# Patient Record
Sex: Male | Born: 1990 | Race: Black or African American | Hispanic: No
Health system: Southern US, Community
[De-identification: ages and names within clinical notes are randomized; demographics above are authoritative.]

## PROBLEM LIST (undated history)

## (undated) DIAGNOSIS — D573 Sickle-cell trait: Secondary | ICD-10-CM

---

## 2015-02-25 ENCOUNTER — Inpatient Hospital Stay: Admit: 2015-02-25 | Discharge: 2015-02-26 | Payer: BLUE CROSS/BLUE SHIELD | Attending: Emergency Medicine

## 2015-02-25 ENCOUNTER — Emergency Department
Admit: 2015-02-25 | Payer: BLUE CROSS/BLUE SHIELD | Primary: Student in an Organized Health Care Education/Training Program

## 2015-02-25 DIAGNOSIS — R569 Unspecified convulsions: Secondary | ICD-10-CM

## 2015-02-25 LAB — CBC WITH AUTOMATED DIFF
BASOPHILS: 0.4 % (ref 0–3)
EOSINOPHILS: 0.1 % (ref 0–5)
HCT: 41.7 % (ref 37.0–50.0)
HGB: 13.3 gm/dl (ref 12.4–17.2)
IMMATURE GRANULOCYTES: 0.4 % (ref 0.0–3.0)
LYMPHOCYTES: 9 % — ABNORMAL LOW (ref 28–48)
MCH: 28.4 pg (ref 23.0–34.6)
MCHC: 31.9 gm/dl (ref 30.0–36.0)
MCV: 88.9 fL (ref 80.0–98.0)
MONOCYTES: 9.1 % (ref 1–13)
MPV: 11 fL — ABNORMAL HIGH (ref 6.0–10.0)
NEUTROPHILS: 81 % — ABNORMAL HIGH (ref 34–64)
NRBC: 0 (ref 0–0)
PLATELET: 177 10*3/uL (ref 140–450)
RBC: 4.69 M/uL (ref 3.80–5.70)
RDW-SD: 43 (ref 35.1–43.9)
WBC: 12.3 10*3/uL — ABNORMAL HIGH (ref 4.0–11.0)

## 2015-02-25 LAB — MAGNESIUM: Magnesium: 1.8 mg/dl (ref 1.8–2.4)

## 2015-02-25 LAB — METABOLIC PANEL, BASIC
BUN: 14 mg/dl (ref 7–25)
CO2: 27 mEq/L (ref 21–32)
Calcium: 8.5 mg/dl (ref 8.5–10.1)
Chloride: 107 mEq/L (ref 98–107)
Creatinine: 1.1 mg/dl (ref 0.6–1.3)
GFR est AA: >60
GFR est non-AA: >60
Glucose: 115 mg/dl — ABNORMAL HIGH (ref 74–106)
Potassium: 4.1 mEq/L (ref 3.5–5.1)
Sodium: 141 mEq/L (ref 136–145)

## 2015-02-25 LAB — ETHYL ALCOHOL: ALCOHOL(ETHYL),SERUM: <3 mg/dl (ref 0.0–9.0)

## 2015-02-25 MED ORDER — SODIUM CHLORIDE 0.9 % IJ SYRG
Freq: Once | INTRAMUSCULAR | Status: DC
Start: 2015-02-25 — End: 2015-02-26

## 2015-02-25 NOTE — ED Notes (Signed)
witnessed seizure

## 2015-02-25 NOTE — ED Notes (Signed)
I have reviewed discharge instructions with the patient.  The patient verbalized understanding.Patient armband removed and given to patient to take home.  Patient was informed of the privacy risks if armband lost or stolen. Lab and CT result printed and [facility] with pt.

## 2015-02-26 LAB — DRUG SCREEN, URINE
Amphetamine: NEGATIVE
Barbiturates: NEGATIVE
Benzodiazepines: NEGATIVE
Cocaine: NEGATIVE
Marijuana: NEGATIVE
Methadone: NEGATIVE
Opiates: NEGATIVE
Phencyclidine: NEGATIVE

## 2015-02-26 LAB — EKG, 12 LEAD, INITIAL
Atrial Rate: 77 {beats}/min
Calculated P Axis: 75 degrees
Calculated R Axis: 79 degrees
Calculated T Axis: 54 degrees
P-R Interval: 172 ms
Q-T Interval: 372 ms
QRS Duration: 88 ms
QTC Calculation (Bezet): 420 ms
Ventricular Rate: 77 {beats}/min

## 2015-02-26 NOTE — ED Provider Notes (Signed)
[facility] TREATMENT REPORT  NAME:  Rylee, Arad  SEX:   M  ADMIT: 02/25/2015  DOB:   04-01-1991  MR#    [id number]  ROOM:  [id number]  TIME DICTATED: 08 52 PM  ACCT#  [id number]        CHIEF COMPLAINT:  Seizure.    HISTORY OF PRESENT ILLNESS:  This is a 24 year old male who [facility]   with a history of witnessed seizure. The paperwork at bedside and the staff   states that he was seen to have convulsions by the medical staff at facility   after he was in the general population in his pod became shaking.  He was   noted by EMS to have an episode of nausea, vomiting and postictal.  The   patient himself is still very somnolent and when aroused he is somewhat   belligerent and states that he has no history of seizures and is very   cooperative when you wake the patient. Unable to perform adequate HPI and ROS   within the constraints imposed by the patient's altered mental status.    PAST MEDICAL HISTORY:  Gathered from paperwork is none.    FAMILY HISTORY:  Unknown.    SOCIAL HISTORY:  Incarcerated male with no known access to alcohol, or tobacco.    CURRENT MEDICATIONS:  None.    ALLERGIES:   NKDA.    PHYSICAL EXAMINATION:  VITAL SIGNS:  Blood pressure 106/50, pulse 70, respirations 16, O2 sat 97% on   room air.  GENERAL APPEARANCE:  This is a well-developed, well-nourished, nonacute   appearing male who appears to be in no distress.  HEENT:  Eyes:  Conjunctivae clear, lids normal. Pupils equal, symmetrical and   normally reactive. Bilateral TMs nonedematous.  Surface of the pharynx, palate   and tongue pink, moist.  NECK:  Supple, nontender, symmetric, no masses or JVD, trachea midline,   thyroid not enlarged, nodular or tender. No cervical or submandibular   lymphadenopathy palpated.  RESPIRATORY: Clear and equal breath sounds.  No respiratory distress,   tachypnea, or accessory muscle use.   CARDIOVASCULAR:  Heart regular without murmurs, gallops, rubs or thrills.  CHEST:  Chest symmetrical without masses or tenderness.   GASTROINTESTINAL:  Abdomen soft, nontender, without complaint of pain to   palpation. No hepatomegaly or splenomegaly.  SKIN:  Warm and dry without rashes.   NEUROLOGIC:  The patient appears to be  somnolent.  He is arousable to   stimulus when I shake him Once again he is noncooperative with neurologic   exam.    INITIAL ASSESSMENT AND MANAGEMENT PLAN:  A 24 year old male who presents with above HPI.  At this time, we will order   UDS, CT of the head to rule out any intracranial abnormalities. We will check   electrolytes. Rule out infection, electrolyte, intracranial abnormalities as a   cause of his seizure.    CONTINUATION BY Imogene BurnICHARD Ayonna Speranza, MD    DIAGNOSTIC INTERPRETATION:   CBC shows minimal elevation of white count at 12.3.  Chemistry is essentially   normal.  Tox screen is negative.  Blood alcohol level was less than 3.  CT of   the head is read negative acute by radiology.  A 12-lead EKG shows sinus   rhythm at 77 with occasional PVCs and early repolarization.     EMERGENCY DEPARTMENT COURSE:   The patient's labs have all returned normal. He has not  had any further   seizure activity.  However, this appears to be a new-onset seizure, and he is   strongly admonished that he cannot drive, cannot operate machinery until he is   cleared by neurology.  He is given neurology for follow up and he is to   return should he have any worsening in the meantime.     DIAGNOSIS:  New onset seizure.    DISPOSITION:  The patient is discharged home in stable condition, with instructions to   follow up with their regular doctor.  They are advised to return immediately   for any worsening or symptoms of concern.      ___________________  Imogene Burn M.D.  Dictated By: Shireen Quan, PA-C    My signature above authenticates this document and my orders, the final   diagnosis (es), discharge prescription (s), and instructions in the [facility]   Pul[facility]ck record.  Nursing notes have been reviewed by the physician/mid-level provider.    If you have any questions please contact [phone].    [facility]  D:02/25/2015 20:52:37  T: 02/26/2015 09:81:19  [id number]

## 2019-06-03 DIAGNOSIS — R112 Nausea with vomiting, unspecified: Secondary | ICD-10-CM

## 2019-06-03 DIAGNOSIS — E860 Dehydration: Secondary | ICD-10-CM

## 2019-06-03 DIAGNOSIS — M6282 Rhabdomyolysis: Secondary | ICD-10-CM

## 2019-06-03 DIAGNOSIS — N179 Acute kidney failure, unspecified: Secondary | ICD-10-CM

## 2019-06-03 DIAGNOSIS — E876 Hypokalemia: Secondary | ICD-10-CM

## 2021-02-23 ENCOUNTER — Encounter (HOSPITAL_COMMUNITY): Payer: Self-pay

## 2021-02-23 ENCOUNTER — Emergency Department (HOSPITAL_COMMUNITY): Payer: Self-pay

## 2021-02-23 ENCOUNTER — Inpatient Hospital Stay (HOSPITAL_COMMUNITY)
Admission: EM | Admit: 2021-02-23 | Discharge: 2021-02-26 | DRG: 917 | Disposition: A | Discharge status: Home / Self Care | Payer: Self-pay | Attending: Internal Medicine | Admitting: Internal Medicine

## 2021-02-23 DIAGNOSIS — E860 Dehydration: Secondary | ICD-10-CM | POA: Diagnosis present

## 2021-02-23 DIAGNOSIS — T405X1A Poisoning by cocaine, accidental (unintentional), initial encounter: Principal | ICD-10-CM | POA: Diagnosis present

## 2021-02-23 DIAGNOSIS — D5700 Hb-SS disease with crisis, unspecified: Secondary | ICD-10-CM | POA: Diagnosis present

## 2021-02-23 DIAGNOSIS — A419 Sepsis, unspecified organism: Secondary | ICD-10-CM

## 2021-02-23 DIAGNOSIS — K521 Toxic gastroenteritis and colitis: Secondary | ICD-10-CM | POA: Diagnosis present

## 2021-02-23 DIAGNOSIS — Y929 Unspecified place or not applicable: Secondary | ICD-10-CM

## 2021-02-23 DIAGNOSIS — A09 Infectious gastroenteritis and colitis, unspecified: Secondary | ICD-10-CM | POA: Diagnosis present

## 2021-02-23 DIAGNOSIS — E871 Hypo-osmolality and hyponatremia: Secondary | ICD-10-CM | POA: Diagnosis present

## 2021-02-23 DIAGNOSIS — K529 Noninfective gastroenteritis and colitis, unspecified: Secondary | ICD-10-CM | POA: Diagnosis present

## 2021-02-23 DIAGNOSIS — E876 Hypokalemia: Secondary | ICD-10-CM | POA: Diagnosis present

## 2021-02-23 DIAGNOSIS — R197 Diarrhea, unspecified: Secondary | ICD-10-CM

## 2021-02-23 DIAGNOSIS — Z20822 Contact with and (suspected) exposure to covid-19: Secondary | ICD-10-CM | POA: Diagnosis present

## 2021-02-23 DIAGNOSIS — F1210 Cannabis abuse, uncomplicated: Secondary | ICD-10-CM | POA: Diagnosis present

## 2021-02-23 DIAGNOSIS — F1510 Other stimulant abuse, uncomplicated: Secondary | ICD-10-CM | POA: Diagnosis present

## 2021-02-23 DIAGNOSIS — D689 Coagulation defect, unspecified: Secondary | ICD-10-CM | POA: Diagnosis present

## 2021-02-23 DIAGNOSIS — G9341 Metabolic encephalopathy: Secondary | ICD-10-CM | POA: Diagnosis present

## 2021-02-23 DIAGNOSIS — R6520 Severe sepsis without septic shock: Secondary | ICD-10-CM

## 2021-02-23 DIAGNOSIS — R6510 Systemic inflammatory response syndrome (SIRS) of non-infectious origin without acute organ dysfunction: Secondary | ICD-10-CM | POA: Diagnosis present

## 2021-02-23 DIAGNOSIS — D573 Sickle-cell trait: Secondary | ICD-10-CM | POA: Diagnosis present

## 2021-02-23 DIAGNOSIS — E861 Hypovolemia: Secondary | ICD-10-CM | POA: Diagnosis present

## 2021-02-23 DIAGNOSIS — N179 Acute kidney failure, unspecified: Secondary | ICD-10-CM | POA: Diagnosis present

## 2021-02-23 HISTORY — DX: Sickle-cell trait: D57.3

## 2021-02-23 LAB — CBC WITH DIFFERENTIAL/PLATELET
Abs Immature Granulocytes: 0.12 10*3/uL — ABNORMAL HIGH (ref 0.00–0.07)
Basophils Absolute: 0 10*3/uL (ref 0.0–0.1)
Basophils Relative: 0 %
Eosinophils Absolute: 0 10*3/uL (ref 0.0–0.5)
Eosinophils Relative: 0 %
HCT: 50.3 % (ref 39.0–52.0)
Hemoglobin: 17.4 g/dL — ABNORMAL HIGH (ref 13.0–17.0)
Immature Granulocytes: 2 %
Lymphocytes Relative: 11 %
Lymphs Abs: 0.8 10*3/uL (ref 0.7–4.0)
MCH: 28.9 pg (ref 26.0–34.0)
MCHC: 34.6 g/dL (ref 30.0–36.0)
MCV: 83.4 fL (ref 80.0–100.0)
Monocytes Absolute: 1 10*3/uL (ref 0.1–1.0)
Monocytes Relative: 14 %
Neutro Abs: 5.2 10*3/uL (ref 1.7–7.7)
Neutrophils Relative %: 73 %
Platelets: 203 10*3/uL (ref 150–400)
RBC: 6.03 MIL/uL — ABNORMAL HIGH (ref 4.22–5.81)
RDW: 13 % (ref 11.5–15.5)
WBC: 7.2 10*3/uL (ref 4.0–10.5)
nRBC: 0 % (ref 0.0–0.2)

## 2021-02-23 LAB — COMPREHENSIVE METABOLIC PANEL
ALT: 19 U/L (ref 0–44)
AST: 23 U/L (ref 15–41)
Albumin: 3.4 g/dL — ABNORMAL LOW (ref 3.5–5.0)
Alkaline Phosphatase: 54 U/L (ref 38–126)
Anion gap: 12 (ref 5–15)
BUN: 19 mg/dL (ref 6–20)
CO2: 24 mmol/L (ref 22–32)
Calcium: 8.7 mg/dL — ABNORMAL LOW (ref 8.9–10.3)
Chloride: 92 mmol/L — ABNORMAL LOW (ref 98–111)
Creatinine, Ser: 1.78 mg/dL — ABNORMAL HIGH (ref 0.61–1.24)
GFR, Estimated: 52 mL/min — ABNORMAL LOW (ref >60)
Glucose, Bld: 132 mg/dL — ABNORMAL HIGH (ref 70–99)
Potassium: 3.6 mmol/L (ref 3.5–5.1)
Sodium: 128 mmol/L — ABNORMAL LOW (ref 135–145)
Total Bilirubin: 0.7 mg/dL (ref 0.3–1.2)
Total Protein: 7 g/dL (ref 6.5–8.1)

## 2021-02-23 LAB — RETICULOCYTES
Immature Retic Fract: 14.6 % (ref 2.3–15.9)
RBC.: 5.96 MIL/uL — ABNORMAL HIGH (ref 4.22–5.81)
Retic Count, Absolute: 71.5 10*3/uL (ref 19.0–186.0)
Retic Ct Pct: 1.2 % (ref 0.4–3.1)

## 2021-02-23 LAB — PROTIME-INR
INR: 1.5 — ABNORMAL HIGH (ref 0.8–1.2)
Prothrombin Time: 17.3 seconds — ABNORMAL HIGH (ref 11.4–15.2)

## 2021-02-23 LAB — TYPE AND SCREEN
ABO/RH(D): O POS
Antibody Screen: NEGATIVE

## 2021-02-23 LAB — APTT: aPTT: 30 seconds (ref 24–36)

## 2021-02-23 LAB — LACTIC ACID, PLASMA: Lactic Acid, Venous: 1.9 mmol/L (ref 0.5–1.9)

## 2021-02-23 MED ORDER — LACTATED RINGERS IV BOLUS (SEPSIS)
1000.0000 mL | Freq: Once | INTRAVENOUS | Status: AC
  Administered 2021-02-24: 1000 mL via INTRAVENOUS

## 2021-02-23 MED ORDER — METRONIDAZOLE IN NACL 5-0.79 MG/ML-% IV SOLN
500.0000 mg | Freq: Once | INTRAVENOUS | Status: AC
  Administered 2021-02-24: 500 mg via INTRAVENOUS
  Filled 2021-02-23: qty 100

## 2021-02-23 MED ORDER — VANCOMYCIN HCL 1250 MG/250ML IV SOLN
1250.0000 mg | Freq: Once | INTRAVENOUS | Status: AC
  Administered 2021-02-24: 1250 mg via INTRAVENOUS
  Filled 2021-02-23: qty 250

## 2021-02-23 MED ORDER — DEXTROSE-NACL 5-0.45 % IV SOLN: INTRAVENOUS | Status: DC

## 2021-02-23 MED ORDER — KETOROLAC TROMETHAMINE 15 MG/ML IJ SOLN
15.0000 mg | INTRAMUSCULAR | Status: AC
  Administered 2021-02-24: 15 mg via INTRAVENOUS
  Filled 2021-02-23: qty 1

## 2021-02-23 MED ORDER — HYDROMORPHONE HCL 1 MG/ML IJ SOLN
0.5000 mg | INTRAMUSCULAR | Status: AC
  Administered 2021-02-24: 0.5 mg via INTRAVENOUS
  Filled 2021-02-23: qty 1

## 2021-02-23 MED ORDER — LACTATED RINGERS IV BOLUS (SEPSIS)
250.0000 mL | Freq: Once | INTRAVENOUS | Status: AC
  Administered 2021-02-24: 250 mL via INTRAVENOUS

## 2021-02-23 MED ORDER — HYDROMORPHONE HCL 1 MG/ML IJ SOLN
1.0000 mg | INTRAMUSCULAR | Status: AC
Start: 2021-02-23 — End: 2021-02-23

## 2021-02-23 MED ORDER — SODIUM CHLORIDE 0.9 % IV SOLN
2.0000 g | Freq: Once | INTRAVENOUS | Status: AC
  Administered 2021-02-24: 2 g via INTRAVENOUS
  Filled 2021-02-23: qty 2

## 2021-02-23 MED ORDER — LACTATED RINGERS IV SOLN: INTRAVENOUS | Status: AC

## 2021-02-23 MED ORDER — ONDANSETRON HCL 4 MG/2ML IJ SOLN: 4.0000 mg | INTRAMUSCULAR | Status: DC | PRN

## 2021-02-23 MED ORDER — ACETAMINOPHEN 325 MG PO TABS
650.0000 mg | ORAL_TABLET | Freq: Once | ORAL | Status: AC
  Administered 2021-02-24: 650 mg via ORAL
  Filled 2021-02-23: qty 2

## 2021-02-23 NOTE — ED Triage Notes (Signed)
Pt comes via [facility] for sickle cell pain for the past 3 days, pt does not have any home mediations for his sickle cell

## 2021-02-23 NOTE — ED Provider Notes (Signed)
[facility] EMERGENCY DEPARTMENT Provider Note   CSN: [id number] Arrival date & time: 02/23/21  2208     History Chief Complaint  Patient presents with  . Sickle Cell Pain Crisis    Lance Butler is a 30 y.o. 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 Associated symptoms include nausea, vomiting, diarrhea and dehydration.  Pt lethargic - Level 5 CAVEAT for AMS  Wife provides additional hx.  She reports he has been sick for the last 3 days with N/V/D, weakness, pain and tactile fevers.  She reports hx of sickle cell, but does not receive any outpatient treatment.  She reports when he has a a pain crisis he tries to deal with it at home but sometimes needs to be admitted.  Last admission was approx 5 months ago at [facility].  She reports he is lethargic and cannot stay awake more than a few minutes at a time, prompting her call to 911.  She reports sometimes he is sleepy during a pain crisis, but he seems extra sleepy today.  She denies other medical problems, meds or allergies.  She reports patient's diarrhea is nonbloody and is "pure water."  No known sick contacts.  She reports he is vaccinated for COVID.     The history is provided by the patient, medical records and the spouse. The history is limited by the condition of the patient. No language interpreter was used.       Past Medical History:  Diagnosis Date  . Sickle cell trait [facility])     Patient Active Problem List   Diagnosis Date Noted  . SIRS (systemic inflammatory response syndrome) (HCC) 02/24/2021    History reviewed. No pertinent surgical history.     No family history on file.     Home Medications Prior to Admission medications   Not on File    Allergies    Patient has no known allergies.  Review of Systems   Review of Systems  Unable to perform ROS:  Mental status change    Physical Exam Updated Vital Signs BP 117/77   Pulse 82   Temp 98.3 F (36.8 C) (Oral)   Resp 18   Ht 5\' 4"  (1.626 m)   Wt 70.3 kg   SpO2 99%   BMI 26.61 kg/m   Physical Exam Vitals and nursing note reviewed.  Constitutional:      General: He is not in acute distress.    Appearance: He is ill-appearing. He is not diaphoretic.     Comments: Pt requires constant arousal to stay awake and answer questions.  Is able to answer questions when awake.   HENT:     Head: Normocephalic.     Jaw: There is normal jaw occlusion.     Mouth/Throat:     Mouth: Mucous membranes are moist.  Eyes:     General: No scleral icterus.    Extraocular Movements: Extraocular movements intact.     Conjunctiva/sclera: Conjunctivae normal.  Cardiovascular:     Rate and Rhythm: Regular rhythm. Tachycardia present.     Pulses: Normal pulses.          Radial pulses are 2+ on the right side and 2+ on the left side.  Pulmonary:     Effort: Pulmonary effort is normal. No tachypnea, accessory muscle usage, prolonged expiration, respiratory distress or retractions.     Breath sounds: Normal  breath sounds. No stridor.     Comments: Equal chest rise. No increased work of breathing. Abdominal:     General: There is no distension.     Palpations: Abdomen is soft.     Tenderness: There is no abdominal tenderness. There is no right CVA tenderness, left CVA tenderness, guarding or rebound.  Musculoskeletal:     Cervical back: Full passive range of motion without pain and normal range of motion. No spinous process tenderness or muscular tenderness. Normal range of motion.     Comments: Moves all extremities equally and without difficulty. No joint swelling noted  Skin:    General: Skin is warm and dry.     Capillary Refill: Capillary refill takes less than 2 seconds.     Comments: Hot to touch  Neurological:     Mental Status: He is lethargic.     GCS: GCS eye subscore is 3. GCS verbal  subscore is 5. GCS motor subscore is 6.     Comments: Speech is clear and goal oriented when aroused.   Follows commands. No focal deficits. Gait testing deferred.    Psychiatric:        Mood and Affect: Mood normal.     ED Results / Procedures / Treatments   Labs (all labs ordered are listed, but only abnormal results are displayed) Labs Reviewed  COMPREHENSIVE METABOLIC PANEL - Abnormal; Notable for the following components:      Result Value   Sodium 128 (*)    Chloride 92 (*)    Glucose, Bld 132 (*)    Creatinine, Ser 1.78 (*)    Calcium 8.7 (*)    Albumin 3.4 (*)    GFR, Estimated 52 (*)    All other components within normal limits  CBC WITH DIFFERENTIAL/PLATELET - Abnormal; Notable for the following components:   RBC 6.03 (*)    Hemoglobin 17.4 (*)    Abs Immature Granulocytes 0.12 (*)    All other components within normal limits  RETICULOCYTES - Abnormal; Notable for the following components:   RBC. 5.96 (*)    All other components within normal limits  PROTIME-INR - Abnormal; Notable for the following components:   Prothrombin Time 17.3 (*)    INR 1.5 (*)    All other components within normal limits  BASIC METABOLIC PANEL - Abnormal; Notable for the following components:   Sodium 132 (*)    Potassium 3.3 (*)    Chloride 95 (*)    Glucose, Bld 129 (*)    BUN 22 (*)    Creatinine, Ser 1.85 (*)    Calcium 8.4 (*)    GFR, Estimated 50 (*)    All other components within normal limits  URINE CULTURE  CULTURE, BLOOD (SINGLE)  CULTURE, BLOOD (SINGLE)  SARS CORONAVIRUS 2 (TAT 6-24 HRS)  APTT  LACTIC ACID, PLASMA  URINALYSIS, ROUTINE W REFLEX MICROSCOPIC  LACTIC ACID, PLASMA  TYPE AND SCREEN  ABO/RH    Radiology DG Chest 2 View  Result Date: 02/23/2021 CLINICAL DATA:  Sickle cell crisis EXAM: CHEST - 2 VIEW COMPARISON:  11/13/2018 FINDINGS: The heart size and mediastinal contours are within normal limits. Both lungs are clear. The visualized skeletal  structures are unremarkable. IMPRESSION: No active cardiopulmonary disease. Electronically Signed   By: Deatra Robinson M.D.   On: 02/23/2021 23:09    Procedures .Critical Care Performed by: Dierdre Forth, PA-C Authorized by: Dierdre Forth, PA-C   Critical care provider statement:    Critical care  time (minutes):  45   Critical care time was exclusive of:  Separately billable procedures and treating other patients and teaching time   Critical care was necessary to treat or prevent imminent or life-threatening deterioration of the following conditions:  Circulatory failure and sepsis   Critical care was time spent personally by me on the following activities:  Discussions with consultants, evaluation of patient's response to treatment, examination of patient, ordering and performing treatments and interventions, ordering and review of laboratory studies, ordering and review of radiographic studies, pulse oximetry, re-evaluation of patient's condition, obtaining history from patient or surrogate and review of old charts   I assumed direction of critical care for this patient from another provider in my specialty: no     Care discussed with: admitting provider       Medications Ordered in ED Medications  HYDROmorphone (DILAUDID) injection 1 mg (1 mg Intravenous Not Given 02/24/21 0138)  dextrose 5 %-0.45 % sodium chloride infusion (0 mLs Intravenous Hold 02/24/21 0221)  ondansetron (ZOFRAN) injection 4 mg (has no administration in time range)  lactated ringers infusion ( Intravenous New Bag/Given 02/24/21 0312)  loperamide (IMODIUM) capsule 4 mg (has no administration in time range)  promethazine (PHENERGAN) 25 mg in sodium chloride 0.9 % 50 mL IVPB (has no administration in time range)  HYDROmorphone (DILAUDID) injection 0.5 mg (0.5 mg Intravenous Given 02/24/21 0022)  ketorolac (TORADOL) 15 MG/ML injection 15 mg (15 mg Intravenous Given 02/24/21 0020)  acetaminophen (TYLENOL) tablet  650 mg (650 mg Oral Given 02/24/21 0032)  lactated ringers bolus 1,000 mL (0 mLs Intravenous Stopped 02/24/21 0216)    And  lactated ringers bolus 1,000 mL (0 mLs Intravenous Stopped 02/24/21 0217)    And  lactated ringers bolus 250 mL (0 mLs Intravenous Stopped 02/24/21 0311)  ceFEPIme (MAXIPIME) 2 g in sodium chloride 0.9 % 100 mL IVPB (0 g Intravenous Stopped 02/24/21 0148)  metroNIDAZOLE (FLAGYL) IVPB 500 mg (0 mg Intravenous Stopped 02/24/21 0311)  vancomycin (VANCOREADY) IVPB 1250 mg/250 mL (0 mg Intravenous Stopped 02/24/21 0217)    ED Course  I have reviewed the triage vital signs and the nursing notes.  Pertinent labs & imaging results that were available during my care of the patient were reviewed by me and considered in my medical decision making (see chart for details).  Clinical Course as of 02/24/21 2993  Wynelle Link Feb 23, 2021  2313 Temp(!): 102.5 F (39.2 C) Noted - tylenol given. Code sepsis initiated [HM]  2314 Hemoglobin(!): 17.4 hemoconcentrated [HM]  2314 Pulse Rate(!): 126 Tachycardic [HM]  2317 Lactic Acid, Venous: 1.9 Less than 2 [HM]    Clinical Course User Index [HM] Muthersbaugh, Boyd Kerbs   MDM Rules/Calculators/A&P                           Patient presents with body pain, tactile fever, nausea vomiting and diarrhea.   Considering Sickle cell pain crisis, sepsis, acute chest syndrome, acute vaso occlusive crisis, gastroenteritis, dehydration.    Additional history obtained:  Additional history obtained from  for bedside. Previous records obtained and reviewed.    Lab Tests:  I Ordered, reviewed, and interpreted labs, which included:  Sepsis work-up: CBC appears hemoconcentrated with elevated hemoglobin.  Patient reports history of sickle cell however chart reports sickle cell trait. Recheck count within normal limits.  Doubt sickle cell disease at this time. CMP with hyponatremia, hypochloremia and AKI.  Unknown baseline for patient  ECG:  I  personally viewed and interpreted the ECG obtained.  It is tachycardia without ischemia.   Imaging Studies ordered:  I have placed order for imaging including chest x-ray. I personally reviewed and interpreted imaging.  No evidence of consolidation, pneumothorax, pulmonary edema or pneumonia.   ED Course:   11:17 PM Febrile and tachycardic here in the emergency department.  Code sepsis initiated with broad-spectrum antibiotics.  12:38 AM Patient with hyponatremia and AKI.  Suspect severe dehydration.  Additional labs pending.  Highly doubt sickle cell anemia at this time given lab work.    3:00 AM Vitals have improved significantly.  Fluids have been given.  Lactic acid remains within normal limits.  Patient still having intermittent diarrhea but no urination at this time.  6:08 AM Patient has been given 3 L of fluid but continues to be anuric.  Repeat BMP shows improvement in sodium levels but creatinine has worsened.  Patient continues to have profuse and watery diarrhea here in the emergency department.  Imodium given.  He will need admission for rehydration and treatment of sepsis.   The patient was discussed with and evaluated by Dr. Bebe ShaggyWickline who agrees with the treatment plan.  6:25 AM Pt with continued vomiting and diarrhea.  Patient still has not urinated.  Abdomen is soft and nontender.  Nondistended.  No clinical evidence of urinary retention.  Additional medications ordered.  6:33 AM Discussed patient's case with hospitalist, Dr. Toniann FailKakrakandy.  I have recommended admission and patient (and family if present) agree with this plan. Admitting physician will place admission orders.    Patient voiced understanding and agreement wit the current medical evaluation and treatment plan.  Questions were answered to expressed satisfaction.    Portions of this note were generated with Scientist, clinical (histocompatibility and immunogenetics)Dragon dictation software. Dictation errors may occur despite best attempts at proofreading.   Final  Clinical Impression(s) / ED Diagnoses Final diagnoses:  AKI (acute kidney injury) (HCC)  Dehydration  Diarrhea of presumed infectious origin  Sepsis with acute renal failure without septic shock, due to unspecified organism, unspecified acute renal failure type [facility](HCC)    Rx / DC Orders ED Discharge Orders    None       Muthersbaugh, Boyd KerbsHannah, PA-C 02/24/21 [id number]    Zadie RhineWickline, Donald, MD 02/24/21 2321

## 2021-02-24 ENCOUNTER — Observation Stay (HOSPITAL_COMMUNITY): Payer: Self-pay

## 2021-02-24 DIAGNOSIS — K529 Noninfective gastroenteritis and colitis, unspecified: Secondary | ICD-10-CM

## 2021-02-24 DIAGNOSIS — E876 Hypokalemia: Secondary | ICD-10-CM

## 2021-02-24 DIAGNOSIS — N179 Acute kidney failure, unspecified: Secondary | ICD-10-CM

## 2021-02-24 DIAGNOSIS — E871 Hypo-osmolality and hyponatremia: Secondary | ICD-10-CM

## 2021-02-24 DIAGNOSIS — R6510 Systemic inflammatory response syndrome (SIRS) of non-infectious origin without acute organ dysfunction: Secondary | ICD-10-CM

## 2021-02-24 DIAGNOSIS — D689 Coagulation defect, unspecified: Secondary | ICD-10-CM

## 2021-02-24 DIAGNOSIS — D573 Sickle-cell trait: Secondary | ICD-10-CM

## 2021-02-24 LAB — BASIC METABOLIC PANEL
Anion gap: 7 (ref 5–15)
BUN: 22 mg/dL — ABNORMAL HIGH (ref 6–20)
CO2: 30 mmol/L (ref 22–32)
Calcium: 8.4 mg/dL — ABNORMAL LOW (ref 8.9–10.3)
Chloride: 95 mmol/L — ABNORMAL LOW (ref 98–111)
Creatinine, Ser: 1.85 mg/dL — ABNORMAL HIGH (ref 0.61–1.24)
GFR, Estimated: 50 mL/min — ABNORMAL LOW (ref >60)
Glucose, Bld: 129 mg/dL — ABNORMAL HIGH (ref 70–99)
Potassium: 3.3 mmol/L — ABNORMAL LOW (ref 3.5–5.1)
Sodium: 132 mmol/L — ABNORMAL LOW (ref 135–145)

## 2021-02-24 LAB — HIV ANTIBODY (ROUTINE TESTING W REFLEX): HIV Screen 4th Generation wRfx: NONREACTIVE

## 2021-02-24 LAB — CK: Total CK: 100 U/L (ref 49–397)

## 2021-02-24 LAB — LIPASE, BLOOD: Lipase: 33 U/L (ref 11–51)

## 2021-02-24 LAB — SARS CORONAVIRUS 2 (TAT 6-24 HRS): SARS Coronavirus 2: NEGATIVE

## 2021-02-24 MED ORDER — SODIUM CHLORIDE 0.9 % IV SOLN
2.0000 g | Freq: Two times a day (BID) | INTRAVENOUS | Status: DC
  Administered 2021-02-24 – 2021-02-25 (×3): 2 g via INTRAVENOUS
  Filled 2021-02-24 (×3): qty 2

## 2021-02-24 MED ORDER — SODIUM CHLORIDE 0.9 % IV SOLN
1.0000 g | Freq: Once | INTRAVENOUS | Status: AC
  Administered 2021-02-24: 1 g via INTRAVENOUS
  Filled 2021-02-24: qty 10

## 2021-02-24 MED ORDER — ONDANSETRON HCL 4 MG/2ML IJ SOLN
4.0000 mg | Freq: Four times a day (QID) | INTRAMUSCULAR | Status: DC | PRN
  Administered 2021-02-24 – 2021-02-25 (×2): 4 mg via INTRAVENOUS
  Filled 2021-02-24 (×2): qty 2

## 2021-02-24 MED ORDER — LOPERAMIDE HCL 2 MG PO CAPS
4.0000 mg | ORAL_CAPSULE | Freq: Once | ORAL | Status: AC
  Administered 2021-02-24: 4 mg via ORAL
  Filled 2021-02-24: qty 2

## 2021-02-24 MED ORDER — METRONIDAZOLE IN NACL 5-0.79 MG/ML-% IV SOLN
500.0000 mg | Freq: Three times a day (TID) | INTRAVENOUS | Status: DC
  Administered 2021-02-24 – 2021-02-26 (×6): 500 mg via INTRAVENOUS
  Filled 2021-02-24 (×6): qty 100

## 2021-02-24 MED ORDER — ACETAMINOPHEN 650 MG RE SUPP: 650.0000 mg | Freq: Four times a day (QID) | RECTAL | Status: DC | PRN

## 2021-02-24 MED ORDER — VANCOMYCIN HCL 1000 MG/200ML IV SOLN
1000.0000 mg | INTRAVENOUS | Status: DC
  Administered 2021-02-25: 1000 mg via INTRAVENOUS
  Filled 2021-02-24: qty 200

## 2021-02-24 MED ORDER — ONDANSETRON HCL 4 MG PO TABS: 4.0000 mg | ORAL_TABLET | Freq: Four times a day (QID) | ORAL | Status: DC | PRN

## 2021-02-24 MED ORDER — SODIUM CHLORIDE 0.9 % IV SOLN
25.0000 mg | Freq: Once | INTRAVENOUS | Status: AC
  Administered 2021-02-24: 25 mg via INTRAVENOUS
  Filled 2021-02-24: qty 1

## 2021-02-24 MED ORDER — LOPERAMIDE HCL 2 MG PO CAPS
4.0000 mg | ORAL_CAPSULE | Freq: Once | ORAL | Status: DC
  Filled 2021-02-24: qty 2

## 2021-02-24 MED ORDER — MORPHINE SULFATE (PF) 2 MG/ML IV SOLN
2.0000 mg | INTRAVENOUS | Status: DC | PRN
Start: 2021-02-24 — End: 2021-02-25
  Administered 2021-02-24 – 2021-02-25 (×2): 2 mg via INTRAVENOUS
  Filled 2021-02-24 (×2): qty 1

## 2021-02-24 MED ORDER — ACETAMINOPHEN 325 MG PO TABS: 650.0000 mg | ORAL_TABLET | Freq: Four times a day (QID) | ORAL | Status: DC | PRN

## 2021-02-24 MED ORDER — ENOXAPARIN SODIUM 40 MG/0.4ML ~~LOC~~ SOLN
40.0000 mg | SUBCUTANEOUS | Status: DC
  Administered 2021-02-24 – 2021-02-26 (×3): 40 mg via SUBCUTANEOUS
  Filled 2021-02-24 (×2): qty 0.4

## 2021-02-24 MED ORDER — SODIUM CHLORIDE 0.9% FLUSH
3.0000 mL | Freq: Two times a day (BID) | INTRAVENOUS | Status: DC
  Administered 2021-02-24 – 2021-02-25 (×4): 3 mL via INTRAVENOUS

## 2021-02-24 MED ORDER — POTASSIUM CHLORIDE 10 MEQ/100ML IV SOLN
10.0000 meq | INTRAVENOUS | Status: AC
Start: 2021-02-24 — End: 2021-02-24
  Administered 2021-02-24 (×2): 10 meq via INTRAVENOUS
  Filled 2021-02-24 (×2): qty 100

## 2021-02-24 NOTE — ED Notes (Signed)
Walked into room to check on pt and found the pt at the end of the bed having a bowel movement while pt mother was holding the bedpan. This nurse asked pt what happened and told pt that we need to get to the bathroom across the hall instead of using the bathroom in this way. Then asked  "this isnt how you normally go to the bathroom is it?" Pt replies "bitch no I just shit on myself. what does it look like?" This nurse replies "it is right across the hall, lets get you cleaned up and put a gown on then lets get to the bathroom". Pt then says "I want a new nurse because youre being disrespectful". Apologized to pt and said we can get everything together once he gets back from the bathroom

## 2021-02-24 NOTE — Progress Notes (Signed)
Pharmacy Antibiotic Note  Lance Butler is a 30 y.o. male admitted on 02/23/2021 with Unknown source.  Pharmacy has been consulted for Vanc and cefepime dosing. CrCL 39  Vancomycin 1000 mg IV Q 24 hrs. Goal AUC 400-550. Expected AUC: 480 SCr used: 1.85  Plan: Cefepime 2 gm IV q12hr Vanc 1500 mg IV x 1, then 1000 mg IV q24hr Monitor renal function, clinical status, and vanc levels as needed  Height: 5\' 4"  (162.6 cm) Weight: 70.3 kg (155 lb) IBW/kg (Calculated) : 59.2  Temp (24hrs), Avg:99.7 F (37.6 C), Min:98.2 F (36.8 C), Max:102.5 F (39.2 C)  Recent Labs  Lab 02/23/21 2212 02/23/21 2242 02/24/21 0446  WBC 7.2  --   --   CREATININE 1.78*  --  1.85*  LATICACIDVEN  --  1.9  --     Estimated Creatinine Clearance: 49.3 mL/min (A) (by C-G formula based on SCr of 1.85 mg/dL (H)).    No Known Allergies  Thank you for allowing pharmacy to be a part of this patient's care.  02/26/21, PharmD, [facility] Clinical Pharmacist Please see AMION for all Pharmacists' Contact Phone Numbers 02/24/2021, 9:25 AM

## 2021-02-24 NOTE — ED Notes (Signed)
Attempted to call report x2

## 2021-02-24 NOTE — ED Notes (Signed)
Attempted to call report x 3. 

## 2021-02-24 NOTE — H&P (Addendum)
History and Physical    [name] OAC:[id number] DOB: 01-13-1991 DOA: 02/23/2021  Referring MD/NP/PA: Midge Minium PCP: Patient, No Pcp Per  Patient coming from: Home  Chief Complaint: Nausea, vomiting, diarrhea  I have personally briefly reviewed patient's old medical records in [facility]   HPI: Lance Butler is a 30 y.o. male with medical history significant of sickle cell trait presents with complaints of nausea, vomiting, and diarrhea for the last 3 days.  History is somewhat limited due to patient cooperation.  He was initially reported to complain of pain all over.  Notes associated symptoms of fever and abdominal pain.  He is able to tell me that he has sickle cell trait, but has had a prior history of pain crisis before in the past.  Patient states that he is not on any medications at home.  Denies having any blood in stool or emesis.  Denies any recent antibiotics or recent sick contacts to his knowledge.    ED Course: Upon admission into the emergency department patient was seen to be febrile up to 102.5 F, pulse elevated at 126, respiration 12-21, and all other vital signs relatively maintained.  Labs from 3/27 significant for WBC 7.2, hemoglobin 17.4, sodium 128, BUN 19, creatinine 1.78, lactic acid 1.9, and INR 1.5.  Chest x-ray showed no acute abnormalities.  COVID-19 screening was pending. Sepsis protocol with full fluid bolus and empiric antibiotics of cefepime, vancomycin, and metronidazole IV.  Repeat labs today revealed sodium 132, potassium 3.3, BUN 22, creatinine 1.85, and calcium 8.4.  COVID-19 screening was still pending.  Review of Systems  Unable to perform ROS: Mental status change  Constitutional: Positive for fever and malaise/fatigue.  Eyes: Negative for photophobia and pain.  Respiratory: Negative for cough and shortness of breath.   Cardiovascular: Negative for chest pain and leg swelling.  Gastrointestinal: Positive for  abdominal pain, diarrhea, nausea and vomiting.  Genitourinary: Negative for dysuria.  Musculoskeletal: Positive for myalgias. Negative for falls.  Skin: Negative for rash.  Neurological: Negative for speech change, focal weakness and loss of consciousness.  Psychiatric/Behavioral: Negative for substance abuse.    Past Medical History:  Diagnosis Date  . Sickle cell trait (HCC)     History reviewed. No pertinent surgical history.   has no history on file for tobacco use, alcohol use, and drug use.  No Known Allergies  Unable to be obtained due to patient's current condition  Prior to Admission medications   Not on File    Physical Exam:  Constitutional: Young male who appears to be acutely ill  Vitals:   02/24/21 0326 02/24/21 0330 02/24/21 0345 02/24/21 0400  BP:  116/81 115/84 117/77  Pulse:  83 81 82  Resp:  17 20 18   Temp: 98.3 F (36.8 C)     TempSrc: Oral     SpO2:  100% 99% 99%  Weight:      Height:       Eyes: PERRL, lids and conjunctivae normal ENMT: Mucous membranes are dry. Posterior pharynx clear of any exudate or lesions. Normal dentition.  Neck: normal, supple, no masses, no thyromegaly Respiratory: clear to auscultation bilaterally, no wheezing, no crackles. Normal respiratory effort. No accessory muscle use.  Cardiovascular: Regular rate and rhythm, no murmurs / rubs / gallops. No extremity edema. 2+ pedal pulses. No carotid bruits.  Abdomen: Right upper quadrant abdominal tenderness appreciated.  Bowel sounds present. Musculoskeletal: no clubbing / cyanosis. No joint deformity upper and lower extremities.  Good ROM, no contractures. Normal muscle tone.  Skin: no rashes, lesions, ulcers. No induration Neurologic: CN 2-12 grossly intact.  Able to move all extremities Psychiatric: Lethargic, but will awaken and answer questions.    Labs on Admission: I have personally reviewed following labs and imaging studies  CBC: Recent Labs  Lab 02/23/21 2212   WBC 7.2  NEUTROABS 5.2  HGB 17.4*  HCT 50.3  MCV 83.4  PLT 203   Basic Metabolic Panel: Recent Labs  Lab 02/23/21 2212 02/24/21 0446  NA 128* 132*  K 3.6 3.3*  CL 92* 95*  CO2 24 30  GLUCOSE 132* 129*  BUN 19 22*  CREATININE 1.78* 1.85*  CALCIUM 8.7* 8.4*   GFR: Estimated Creatinine Clearance: 49.3 mL/min (A) (by C-G formula based on SCr of 1.85 mg/dL (H)). Liver Function Tests: Recent Labs  Lab 02/23/21 2212  AST 23  ALT 19  ALKPHOS 54  BILITOT 0.7  PROT 7.0  ALBUMIN 3.4*   No results for input(s): LIPASE, AMYLASE in the last 168 hours. No results for input(s): AMMONIA in the last 168 hours. Coagulation Profile: Recent Labs  Lab 02/23/21 2225  INR 1.5*   Cardiac Enzymes: No results for input(s): CKTOTAL, CKMB, CKMBINDEX, TROPONINI in the last 168 hours. BNP (last 3 results) No results for input(s): PROBNP in the last 8760 hours. HbA1C: No results for input(s): HGBA1C in the last 72 hours. CBG: No results for input(s): GLUCAP in the last 168 hours. Lipid Profile: No results for input(s): CHOL, HDL, LDLCALC, TRIG, CHOLHDL, LDLDIRECT in the last 72 hours. Thyroid Function Tests: No results for input(s): TSH, T4TOTAL, FREET4, T3FREE, THYROIDAB in the last 72 hours. Anemia Panel: Recent Labs    02/23/21 2212  RETICCTPCT 1.2   Urine analysis: No results found for: COLORURINE, APPEARANCEUR, LABSPEC, PHURINE, GLUCOSEU, HGBUR, BILIRUBINUR, KETONESUR, PROTEINUR, UROBILINOGEN, NITRITE, LEUKOCYTESUR Sepsis Labs: No results found for this or any previous visit (from the past 240 hour(s)).   Radiological Exams on Admission: DG Chest 2 View  Result Date: 02/23/2021 CLINICAL DATA:  Sickle cell crisis EXAM: CHEST - 2 VIEW COMPARISON:  11/13/2018 FINDINGS: The heart size and mediastinal contours are within normal limits. Both lungs are clear. The visualized skeletal structures are unremarkable. IMPRESSION: No active cardiopulmonary disease. Electronically Signed    By: Deatra Robinson M.D.   On: 02/23/2021 23:09    EKG: ondependently reviewed.  Sinus tachycardia 117 bpm with right axis deviation  Assessment/Plan SIRS/sepsis 2/2 colitis: Acute.  Patient was noted to be febrile up to 102.5 F with tachycardia and tachypnea initially on admission.  WBC 7.2 and initial lactic acid 1.9.  Question possibility of a colitis vs gastroenteritis as a cause of symptoms. -Admit to a medical telemetry bed -Follow-up blood and urine cultures -Check lipase, GI panel -Check CT scan of the abdomen pelvis -Continue empiric antibiotics of vancomycin, cefepime, and metronidazole -Acetaminophen as needed for fever  Sickle cell trait question of pain crisis: Acute.  Patient reports history of sickle cell trait with prior history of pain crisis.  Hemoglobin 17.4 on admission. -IV fluids 150 mL/h -Morphine IV as needed every 2 hours as needed for pain -Recheck CBC in a.m. -Try to obtain records from recent hospitalization at [facility]  Acute metabolic encephalopathy: Patient was noted to be somewhat lethargic, but arousable normal answer questions. -Neuro check -Check UDS  Acute kidney injury: Creatinine on admission 1.78 with BUN 19 and repeat check 1.85 with BUN 22.  Suspect prerenal cause of symptoms.  His baseline creatinine is unknown. -Follow-up urinalysis -Add on CK -Continue IV fluids -Recheck kidney function in a.m.  Hyponatremia: Acute.  Sodium 128->132.  Suspecting a hypovolemic hyponatremia given complaints of nausea, vomiting, and diarrhea. -Normal saline IV fluids at 125 mL/h  Hypokalemia hypocalcemia: Acute.  Labs today revealed potassium 3.3 and calcium 8.4. -Give potassium chloride 20 mEq IV -Give calcium gluconate 1 g IV -Continue to monitor and replace as needed  Coagulopathy: Acute.  On adission INR 1.5. -Recheck PT/INR in a.m.  DVT prophylaxis: Lovenox Code Status: Full Family Communication: Called emergency contact several times with no  answer and left message Disposition Plan: Likely discharge home once medically Consults called: None Admission status: Observation, possibly require less than 24-hour  Clydie Braun MD [facility]   If 7PM-7AM, please contact night-coverage   02/24/2021, 7:41 AM

## 2021-02-24 NOTE — Progress Notes (Signed)
Report received from RN Andruw.  Patient arrived to unit around 2100. VSS. C/o pain and nausea, PRN medications given. IV abx started. Call bell within reach. Bed alarm on.   Patient talked with mother using bedside phone in room.

## 2021-02-24 NOTE — ED Notes (Signed)
Pt once again was reminded to give urine sample, pt said he forgot. Pt been ambulating in the bathroom with walker.

## 2021-02-24 NOTE — ED Notes (Signed)
Pt finished all oral contrast. Pt to CT

## 2021-02-24 NOTE — ED Notes (Signed)
Pt called out for help but was vomiting on the floor and in the trash can in the room . Pt taken to the bathroom. And then returned to bed and hooked back up the the monitor

## 2021-02-24 NOTE — ED Notes (Signed)
Pt back from bathroom in new gown and paper scrub pants. Pt apologizes for everything he said. Pt back on monitor and resting now.

## 2021-02-24 NOTE — ED Notes (Signed)
Attempted to call report

## 2021-02-24 NOTE — ED Notes (Signed)
Pt in the bathroom. Pt given a container for urine collection. Pt acknowledged that urine needs to be collected.

## 2021-02-24 NOTE — ED Notes (Signed)
Pt and mother of pt aware of need for urine

## 2021-02-25 DIAGNOSIS — R197 Diarrhea, unspecified: Secondary | ICD-10-CM

## 2021-02-25 LAB — URINALYSIS, ROUTINE W REFLEX MICROSCOPIC
Bacteria, UA: NONE SEEN
Bilirubin Urine: NEGATIVE
Glucose, UA: 50 mg/dL — AB
Ketones, ur: 5 mg/dL — AB
Leukocytes,Ua: NEGATIVE
Nitrite: NEGATIVE
Protein, ur: 30 mg/dL — AB
Specific Gravity, Urine: 1.021 (ref 1.005–1.030)
pH: 7 (ref 5.0–8.0)

## 2021-02-25 LAB — PROTIME-INR
INR: 1.2 (ref 0.8–1.2)
Prothrombin Time: 14.7 seconds (ref 11.4–15.2)

## 2021-02-25 LAB — CBC
HCT: 42.8 % (ref 39.0–52.0)
Hemoglobin: 15.1 g/dL (ref 13.0–17.0)
MCH: 29 pg (ref 26.0–34.0)
MCHC: 35.3 g/dL (ref 30.0–36.0)
MCV: 82.3 fL (ref 80.0–100.0)
Platelets: 186 10*3/uL (ref 150–400)
RBC: 5.2 MIL/uL (ref 4.22–5.81)
RDW: 12.7 % (ref 11.5–15.5)
WBC: 12 10*3/uL — ABNORMAL HIGH (ref 4.0–10.5)
nRBC: 0 % (ref 0.0–0.2)

## 2021-02-25 LAB — BASIC METABOLIC PANEL
Anion gap: 9 (ref 5–15)
BUN: 21 mg/dL — ABNORMAL HIGH (ref 6–20)
CO2: 28 mmol/L (ref 22–32)
Calcium: 8.5 mg/dL — ABNORMAL LOW (ref 8.9–10.3)
Chloride: 95 mmol/L — ABNORMAL LOW (ref 98–111)
Creatinine, Ser: 1.2 mg/dL (ref 0.61–1.24)
GFR, Estimated: >60 mL/min (ref >60)
Glucose, Bld: 106 mg/dL — ABNORMAL HIGH (ref 70–99)
Potassium: 3.6 mmol/L (ref 3.5–5.1)
Sodium: 132 mmol/L — ABNORMAL LOW (ref 135–145)

## 2021-02-25 LAB — ABO/RH: ABO/RH(D): O POS

## 2021-02-25 LAB — RAPID URINE DRUG SCREEN, HOSP PERFORMED
Amphetamines: POSITIVE — AB
Barbiturates: NOT DETECTED
Benzodiazepines: NOT DETECTED
Cocaine: POSITIVE — AB
Opiates: POSITIVE — AB
Tetrahydrocannabinol: POSITIVE — AB

## 2021-02-25 MED ORDER — MORPHINE SULFATE (PF) 2 MG/ML IV SOLN
2.0000 mg | INTRAVENOUS | Status: DC | PRN
  Administered 2021-02-25 – 2021-02-26 (×4): 2 mg via INTRAVENOUS
  Filled 2021-02-25 (×4): qty 1

## 2021-02-25 MED ORDER — SODIUM CHLORIDE 0.9 % IV SOLN
2.0000 g | INTRAVENOUS | Status: DC
  Administered 2021-02-25: 2 g via INTRAVENOUS
  Filled 2021-02-25: qty 20

## 2021-02-25 MED ORDER — SODIUM CHLORIDE 0.9 % IV SOLN: INTRAVENOUS | Status: DC

## 2021-02-25 NOTE — Progress Notes (Signed)
PROGRESS NOTE                                                                             PROGRESS NOTE                                                                                                                                                                                                             Patient Demographics:    Lance Butler, is a 30 y.o. male, DOB - 12/11/1990, XKP:[id number]  Outpatient Primary MD for the patient is Patient, No Pcp Per (Inactive)    LOS - 0  Admit date - 02/23/2021    Chief Complaint  Patient presents with  . Sickle Cell Butler Crisis       Brief Narrative      Lance Butler is a 30 y.o. male with medical history significant of sickle cell trait presents with complaints of nausea, vomiting, and diarrhea for the last 3 days. In ED  febrile 102.5 F, pulse elevated at 126, respiration 12-21, CT abdomen pelvis significant for sigmoid colon  Colitis, so he was admitted for further management   Subjective:    Lance Butler, still reports watery diarrhea, no further nausea or vomiting, reports appetite has improved, denies any chest Butler.     Assessment  & Plan :    Principal Problem:   SIRS (systemic inflammatory response syndrome) (HCC) Active Problems:   Gastroenteritis   Sickle cell trait (HCC)   AKI (acute kidney injury) (HCC)   Hyponatremia   Hypokalemia   Hypocalcemia   Coagulopathy (HCC)  sepsis 2/2 colitis: - Acute.  Patient was noted to be febrile up to 102.5 F with tachycardia and tachypnea initially on admission. - .Significant for sigmoid colon colitis -Still reports significant watery diarrhea, so we will start on IV fluids 75 cc/h. -Will obtain GI panel -Regular vancomycin , cefepime, and Flagyl , will narrow to Rocephin and Flagyl.    Sickle cell trait - question of Butler crisis: Patient reports he did  not follow with any physician for his sickle  cell, reports this diagnosis was when he was at young age . -Continue with IV fluids and as needed Butler medicine . -Try to obtain records from recent hospitalization at [facility]  Acute metabolic encephalopathy:  -Patient was noted to be somewhat lethargic, but arousable normal answer questions.  Polysubstance abuse -Patient tested positive for cocaine, amphetamine and THC -Reports he does not use IV route, only snorting, he was counseled.  Acute kidney injury: -  Creatinine on admission 1.78 , improving with IV fluids   Hyponatremia: Acute.  Sodium 128->132.  Suspecting a hypovolemic hyponatremia given complaints of nausea, vomiting, and diarrhea. -Continue with IV fluids  Hypokalemia/hypocalcemia:  - repleted   SpO2: 96 %  Recent Labs  Lab 02/23/21 2212 02/23/21 2225 02/23/21 2242 02/24/21 0219 02/25/21 0148  WBC 7.2  --   --   --  12.0*  PLT 203  --   --   --  186  AST 23  --   --   --   --   ALT 19  --   --   --   --   ALKPHOS 54  --   --   --   --   BILITOT 0.7  --   --   --   --   ALBUMIN 3.4*  --   --   --   --   INR  --  1.5*  --   --  1.2  LATICACIDVEN  --   --  1.9  --   --   SARSCOV2NAA  --   --   --  NEGATIVE  --        ABG  No results found for: PHART, PCO2ART, PO2ART, HCO3, TCO2, ACIDBASEDEF, O2SAT       Condition - Extremely Guarded  Family Communication  :  None at bedside  Code Status :  full  Consults  :  none  Disposition Plan  :    Status is: Inpatient  Remains inpatient appropriate because:IV treatments appropriate due to intensity of illness or inability to take PO   Dispo: The patient is from: Home              Anticipated d/c is to: Home              Patient currently is not medically stable to d/c.   Difficult to place patient No      DVT Prophylaxis  :  Lovenox  Lab Results  Component Value Date   PLT 186 02/25/2021    Diet :  Diet Order            Diet regular Room service appropriate? Yes; Fluid  consistency: Thin  Diet effective now                  Inpatient Medications  Scheduled Meds: . enoxaparin (LOVENOX) injection  40 mg Subcutaneous Q24H  . loperamide  4 mg Oral Once  . sodium chloride flush  3 mL Intravenous Q12H   Continuous Infusions: . sodium chloride 75 mL/hr at 02/25/21 1010  . cefTRIAXone (ROCEPHIN)  IV    . metronidazole 500 mg (02/25/21 1340)   PRN Meds:.acetaminophen **OR** acetaminophen, morphine injection, ondansetron **OR** ondansetron (ZOFRAN) IV  Antibiotics  :    Anti-infectives (From admission, onward)   Start     Dose/Rate Route Frequency Ordered Stop   02/25/21 2200  cefTRIAXone (ROCEPHIN) 2 g in sodium chloride 0.9 %  100 mL IVPB        2 g 200 mL/hr over 30 Minutes Intravenous Every 24 hours 02/25/21 1524     02/24/21 2300  vancomycin (VANCOREADY) IVPB 1000 mg/200 mL  Status:  Discontinued        1,000 mg 200 mL/hr over 60 Minutes Intravenous Every 24 hours 02/24/21 0922 02/25/21 1524   02/24/21 1115  metroNIDAZOLE (FLAGYL) IVPB 500 mg        500 mg 100 mL/hr over 60 Minutes Intravenous Every 8 hours 02/24/21 1107     02/24/21 1000  ceFEPIme (MAXIPIME) 2 g in sodium chloride 0.9 % 100 mL IVPB  Status:  Discontinued        2 g 200 mL/hr over 30 Minutes Intravenous Every 12 hours 02/24/21 0920 02/25/21 1524   02/23/21 2315  ceFEPIme (MAXIPIME) 2 g in sodium chloride 0.9 % 100 mL IVPB        2 g 200 mL/hr over 30 Minutes Intravenous  Once 02/23/21 2313 02/24/21 0148   02/23/21 2315  metroNIDAZOLE (FLAGYL) IVPB 500 mg        500 mg 100 mL/hr over 60 Minutes Intravenous  Once 02/23/21 2313 02/24/21 0311   02/23/21 2315  vancomycin (VANCOREADY) IVPB 1250 mg/250 mL        1,250 mg 166.7 mL/hr over 90 Minutes Intravenous  Once 02/23/21 2313 02/24/21 0217       Davonn Flanery M.D on 02/25/2021 at 3:36 PM  To page go to [email or web address]   [facility] -  Office  [phone]      Objective:   Vitals:   02/25/21 0015 02/25/21  0400 02/25/21 0800 02/25/21 1448  BP: 109/71 108/66  107/68  Pulse: 69 67  63  Resp: Temp: 98.5 F (36.9 C) 98.2 F (36.8 C)  97.7 F (36.5 C)  TempSrc: Oral Oral  Oral  SpO2: 98% 100% 96%   Weight:      Height:        Wt Readings from Last 3 Encounters:  02/24/21 70.3 kg     Intake/Output Summary (Last 24 hours) at 02/25/2021 1536 Last data filed at 02/25/2021 1500 Gross per 24 hour  Intake 1064.06 ml  Output 500 ml  Net 564.06 ml     Physical Exam  Awake Alert, No new F.N deficits, Normal affect Symmetrical Chest wall movement, Good air movement bilaterally, CTAB RRR,No Gallops,Rubs or new Murmurs, No Parasternal Heave +ve B.Sounds, Abd Soft,Mild  abdominal tenderness, no rebound - guarding or rigidity. No Cyanosis, Clubbing or edema, No new Rash or bruise     Data Review:    CBC Recent Labs  Lab 02/23/21 2212 02/25/21 0148  WBC 7.2 12.0*  HGB 17.4* 15.1  HCT 50.3 42.8  PLT 203 186  MCV 83.4 82.3  MCH 28.9 29.0  MCHC 34.6 35.3  RDW 13.0 12.7  LYMPHSABS 0.8  --   MONOABS 1.0  --   EOSABS 0.0  --   BASOSABS 0.0  --     Recent Labs  Lab 02/23/21 2212 02/23/21 2225 02/23/21 2242 02/24/21 0446 02/25/21 0148  NA 128*  --   --  132* 132*  K 3.6  --   --  3.3* 3.6  CL 92*  --   --  95* 95*  CO2 24  --   --  30 28  GLUCOSE 132*  --   --  129* 106*  BUN 19  --   --  22* 21*  CREATININE 1.78*  --   --  1.85* 1.20  CALCIUM 8.7*  --   --  8.4* 8.5*  AST 23  --   --   --   --   ALT 19  --   --   --   --   ALKPHOS 54  --   --   --   --   BILITOT 0.7  --   --   --   --   ALBUMIN 3.4*  --   --   --   --   LATICACIDVEN  --   --  1.9  --   --   INR  --  1.5*  --   --  1.2    ------------------------------------------------------------------------------------------------------------------ No results for input(s): CHOL, HDL, LDLCALC, TRIG, CHOLHDL, LDLDIRECT in the last 72 hours.  No results found for:  HGBA1C ------------------------------------------------------------------------------------------------------------------ No results for input(s): TSH, T4TOTAL, T3FREE, THYROIDAB in the last 72 hours.  Invalid input(s): FREET3  Cardiac Enzymes No results for input(s): CKMB, TROPONINI, MYOGLOBIN in the last 168 hours.  Invalid input(s): CK ------------------------------------------------------------------------------------------------------------------ No results found for: BNP  Micro Results Recent Results (from the past 240 hour(s))  Blood culture (routine single)     Status: None (Preliminary result)   Collection Time: 02/23/21 10:42 PM   Specimen: BLOOD  Result Value Ref Range Status   Specimen Description BLOOD RIGHT ANTECUBITAL  Final   Special Requests   Final    BOTTLES DRAWN AEROBIC AND ANAEROBIC Blood Culture adequate volume   Culture   Final    NO GROWTH 2 DAYS Performed at [facility], [address] [place], [place] [address]    Report Status PENDING  Incomplete  SARS CORONAVIRUS 2 (TAT 6-24 HRS) Nasopharyngeal Nasopharyngeal Swab     Status: None   Collection Time: 02/24/21  2:19 AM   Specimen: Nasopharyngeal Swab  Result Value Ref Range Status   SARS Coronavirus 2 NEGATIVE NEGATIVE Final    Comment: (NOTE) SARS-CoV-2 target nucleic acids are NOT DETECTED.  The SARS-CoV-2 RNA is generally detectable in upper and lower respiratory specimens during the acute phase of infection. Negative results do not preclude SARS-CoV-2 infection, do not rule out co-infections with other pathogens, and should not be used as the sole basis for treatment or other patient management decisions. Negative results must be combined with clinical observations, patient history, and epidemiological information. The expected result is Negative.  Fact Sheet for Patients: HairSlick.no  Fact Sheet for Healthcare  Providers: quierodirigir.com  This test is not yet approved or cleared by the [place] FDA and  has been authorized for detection and/or diagnosis of SARS-CoV-2 by FDA under an Emergency Use Authorization (EUA). This EUA will remain  in effect (meaning this test can be used) for the duration of the COVID-19 declaration under Se ction 564(b)(1) of the Act, 21 U.S.C. section 360bbb-3(b)(1), unless the authorization is terminated or revoked sooner.  Performed at [facility], [address] [place], [place] [address]     Radiology Reports CT ABDOMEN PELVIS WO CONTRAST  Result Date: 02/24/2021 CLINICAL DATA:  Abdominal Butler, fever, gradual persistent progressively worsening abdominal Butler all over, sickle cell crisis, associated nausea, vomiting, diarrhea, dehydration EXAM: CT ABDOMEN AND PELVIS WITHOUT CONTRAST TECHNIQUE: Multidetector CT imaging of the abdomen and pelvis was performed following the standard protocol without IV contrast. Sagittal and coronal MPR images reconstructed from axial data set. Dilute oral contrast was administered. COMPARISON:  None FINDINGS: Lower chest: Lung bases clear Hepatobiliary: Gallstone in lower gallbladder segment 6 mm diameter. Gallbladder and liver otherwise normal appearance. Pancreas: Unremarkable Spleen: Normal appearance Adrenals/Urinary Tract: Adrenal glands grossly normal. Kidneys and bladder normal appearance. Ureters not visualized. Stomach/Bowel: Appendix obscured. Bowel wall thickening of sigmoid colon consistent with colitis. Questionable wall thickening of portions of the ascending colon versus artifact from underdistention. Stomach and remaining bowel loops normal appearance. Vascular/Lymphatic: Aorta normal caliber. No gross adenopathy identified though assessment is limited by lack of IV contrast and adequate tissue planes. Reproductive: Unremarkable prostate gland Other: No gross free air or free fluid. Small  linear collection of air is seen within the LEFT rectus abdominus muscle/fascia of uncertain etiology. No additional rectus abdominus findings are identified, with muscles appearing symmetric in size and attenuation without edema or hemorrhage. Musculoskeletal: Osseous structures unremarkable. IMPRESSION: Bowel wall thickening of the sigmoid colon consistent with colitis; differential diagnosis includes infection and inflammatory bowel disease, ischemia considered a less likely cause. Questionable wall thickening of portions of ascending colon versus artifact from underdistention. Cholelithiasis. Small linear collection of air within the LEFT rectus abdominus muscle/fascia of uncertain etiology; this could be related to prior injection or attempted vascular access more inferiorly, recommend clinical correlation. Electronically Signed   By: Ulyses SouthwardMark  Boles M.D.   On: 02/24/2021 15:08   DG Chest 2 View  Result Date: 02/23/2021 CLINICAL DATA:  Sickle cell crisis EXAM: CHEST - 2 VIEW COMPARISON:  11/13/2018 FINDINGS: The heart size and mediastinal contours are within normal limits. Both lungs are clear. The visualized skeletal structures are unremarkable. IMPRESSION: No active cardiopulmonary disease. Electronically Signed   By: Deatra RobinsonKevin  Herman M.D.   On: 02/23/2021 23:09

## 2021-02-25 NOTE — Plan of Care (Signed)
Patient is currently resting in bed. C/o pain and nausea, given PRN meds. VSS. Stood at bedside to void. UA sent. Call bell within reach. Bed alarm on.   I believe patients mother still need to be updated by the provider about plan of care.   Problem: Education: Goal: Knowledge of General Education information will improve Description: Including pain rating scale, medication(s)/side effects and non-pharmacologic comfort measures Outcome: Progressing   Problem: Health Behavior/Discharge Planning: Goal: Ability to manage health-related needs will improve Outcome: Progressing   Problem: Clinical Measurements: Goal: Ability to maintain clinical measurements within normal limits will improve Outcome: Progressing Goal: Will remain free from infection Outcome: Progressing Goal: Diagnostic test results will improve Outcome: Progressing Goal: Respiratory complications will improve Outcome: Progressing Goal: Cardiovascular complication will be avoided Outcome: Progressing   Problem: Activity: Goal: Risk for activity intolerance will decrease Outcome: Progressing   Problem: Nutrition: Goal: Adequate nutrition will be maintained Outcome: Progressing   Problem: Coping: Goal: Level of anxiety will decrease Outcome: Progressing   Problem: Elimination: Goal: Will not experience complications related to bowel motility Outcome: Progressing Goal: Will not experience complications related to urinary retention Outcome: Progressing   Problem: Pain Managment: Goal: General experience of comfort will improve Outcome: Progressing   Problem: Safety: Goal: Ability to remain free from injury will improve Outcome: Progressing   Problem: Skin Integrity: Goal: Risk for impaired skin integrity will decrease Outcome: Progressing

## 2021-02-26 LAB — URINE CULTURE: Culture: NO GROWTH

## 2021-02-26 NOTE — Progress Notes (Signed)
Seen and examined-feels much better.  No further diarrhea.  Claims abdominal pain has essentially resolved.  Anxious to get out of the hospital today.  Stable for discharge-see discharge summary for details.Marland Kitchen

## 2021-02-26 NOTE — Discharge Summary (Signed)
PATIENT DETAILS Name: Lance Butler Age: 30 y.o. Sex: male Date of Birth: June 09, 1991 MRN: [id number]. Admitting Physician: Starleen Arms, MD [id number], No Pcp Per (Inactive)  Admit Date: 02/23/2021 Discharge date: 02/26/2021  Recommendations for Outpatient Follow-up:  1. Follow up with PCP in 1-2 weeks 2. Please obtain CMP/CBC in one week   Admitted From:  Home  Disposition: Home   Home Health: No  Equipment/Devices: None  Discharge Condition: Stable  CODE STATUS: FULL CODE  Diet recommendation:  Diet Order            Diet general           Diet regular Room service appropriate? Yes; Fluid consistency: Thin  Diet effective now                  Brief Summary: See H&P, Labs, Consult and Test reports for all details in brief, patient with history of sickle cell trait-presented with abdominal pain/nausea/vomiting/diarrhea-CT imaging was positive for sigmoid colitis-patient was subsequently admitted to the hospitalist service for further details.  Brief Hospital Course: Acute sigmoid colitis: Etiology unclear-probably either related to an infectious etiology or due to vasospasm from cocaine use.  Diarrhea/abdominal pain has resolved-stool studies never sent as diarrhea resolved rapidly post hospitalization.  Blood cultures are negative so far.  Tolerating advancement in diet.  Anxious to leave the hospital today.  Given rapid clinical improvement-doubt requires further antimicrobial therapy-has been on IV antibiotics since admission.  AKI: Hemodynamically mediated-resolved.  Hyponatremia: Due to hypovolemia-improved.  Acute metabolic encephalopathy: Due to dehydration-resolved.  Polysubstance abuse: UDS positive for cocaine, amphetamine, THC-counseled extensively-explained to patient that he could have had ischemic colitis due to cocaine use.  Have encouraged complete abstinence.   Discharge Diagnoses:  Principal Problem:   SIRS (systemic  inflammatory response syndrome) (HCC) Active Problems:   Gastroenteritis   Sickle cell trait (HCC)   AKI (acute kidney injury) (HCC)   Hyponatremia   Hypokalemia   Hypocalcemia   Coagulopathy (HCC)   Discharge Instructions:  Activity:  As tolerated  Discharge Instructions    Call MD for:  persistant nausea and vomiting   Complete by: As directed    Call MD for:  severe uncontrolled pain   Complete by: As directed    Diet general   Complete by: As directed    Stay on a soft diet for 1 week before advancing any further.   Discharge instructions   Complete by: As directed    Follow with Primary MD  in 1-2 weeks  Stop any further cocaine, amphetamine and marijuana use.  Please get a complete blood count and chemistry panel checked by your Primary MD at your next visit, and again as instructed by your Primary MD.  Get Medicines reviewed and adjusted: Please take all your medications with you for your next visit with your Primary MD  Laboratory/radiological data: Please request your Primary MD to go over all hospital tests and procedure/radiological results at the follow up, please ask your Primary MD to get all Hospital records sent to his/her office.  In some cases, they will be blood work, cultures and biopsy results pending at the time of your discharge. Please request that your primary care M.D. follows up on these results.  Also Note the following: If you experience worsening of your admission symptoms, develop shortness of breath, life threatening emergency, suicidal or homicidal thoughts you must seek medical attention immediately by calling 911 or calling your MD immediately  if symptoms  less severe.  You must read complete instructions/literature along with all the possible adverse reactions/side effects for all the Medicines you take and that have been prescribed to you. Take any new Medicines after you have completely understood and accpet all the possible adverse  reactions/side effects.   Do not drive when taking Pain medications or sleeping medications (Benzodaizepines)  Do not take more than prescribed Pain, Sleep and Anxiety Medications. It is not advisable to combine anxiety,sleep and pain medications without talking with your primary care practitioner  Special Instructions: If you have smoked or chewed Tobacco  in the last 2 yrs please stop smoking, stop any regular Alcohol  and or any Recreational drug use.  Wear Seat belts while driving.  Please note: You were cared for by a hospitalist during your hospital stay. Once you are discharged, your primary care physician will handle any further medical issues. Please note that NO REFILLS for any discharge medications will be authorized once you are discharged, as it is imperative that you return to your primary care physician (or establish a relationship with a primary care physician if you do not have one) for your post hospital discharge needs so that they can reassess your need for medications and monitor your lab values.   Increase activity slowly   Complete by: As directed      Allergies as of 02/26/2021   No Known Allergies     Medication List    TAKE these medications   multivitamin with minerals tablet Take 1 tablet by mouth daily.       Follow-up Information    Primary care MD. Schedule an appointment as soon as possible for a visit in 1 week(s).              No Known Allergies   Other Procedures/Studies: CT ABDOMEN PELVIS WO CONTRAST  Result Date: 02/24/2021 CLINICAL DATA:  Abdominal pain, fever, gradual persistent progressively worsening abdominal pain all over, sickle cell crisis, associated nausea, vomiting, diarrhea, dehydration EXAM: CT ABDOMEN AND PELVIS WITHOUT CONTRAST TECHNIQUE: Multidetector CT imaging of the abdomen and pelvis was performed following the standard protocol without IV contrast. Sagittal and coronal MPR images reconstructed from axial data set.  Dilute oral contrast was administered. COMPARISON:  None FINDINGS: Lower chest: Lung bases clear Hepatobiliary: Gallstone in lower gallbladder segment 6 mm diameter. Gallbladder and liver otherwise normal appearance. Pancreas: Unremarkable Spleen: Normal appearance Adrenals/Urinary Tract: Adrenal glands grossly normal. Kidneys and bladder normal appearance. Ureters not visualized. Stomach/Bowel: Appendix obscured. Bowel wall thickening of sigmoid colon consistent with colitis. Questionable wall thickening of portions of the ascending colon versus artifact from underdistention. Stomach and remaining bowel loops normal appearance. Vascular/Lymphatic: Aorta normal caliber. No gross adenopathy identified though assessment is limited by lack of IV contrast and adequate tissue planes. Reproductive: Unremarkable prostate gland Other: No gross free air or free fluid. Small linear collection of air is seen within the LEFT rectus abdominus muscle/fascia of uncertain etiology. No additional rectus abdominus findings are identified, with muscles appearing symmetric in size and attenuation without edema or hemorrhage. Musculoskeletal: Osseous structures unremarkable. IMPRESSION: Bowel wall thickening of the sigmoid colon consistent with colitis; differential diagnosis includes infection and inflammatory bowel disease, ischemia considered a less likely cause. Questionable wall thickening of portions of ascending colon versus artifact from underdistention. Cholelithiasis. Small linear collection of air within the LEFT rectus abdominus muscle/fascia of uncertain etiology; this could be related to prior injection or attempted vascular access more inferiorly, recommend clinical correlation. Electronically  Signed   By: Ulyses Southward M.D.   On: 02/24/2021 15:08   DG Chest 2 View  Result Date: 02/23/2021 CLINICAL DATA:  Sickle cell crisis EXAM: CHEST - 2 VIEW COMPARISON:  11/13/2018 FINDINGS: The heart size and mediastinal contours  are within normal limits. Both lungs are clear. The visualized skeletal structures are unremarkable. IMPRESSION: No active cardiopulmonary disease. Electronically Signed   By: Deatra Robinson M.D.   On: 02/23/2021 23:09     TODAY-DAY OF DISCHARGE:  Subjective:   Lance Butler today has no headache,no chest abdominal pain,no new weakness tingling or numbness, feels much better wants to go home today.   Objective:   Blood pressure 114/71, pulse 63, temperature 98 F (36.7 C), temperature source Oral, resp. rate 12, height 5\' 4"  (1.626 m), weight 70.3 kg, SpO2 95 %.  Intake/Output Summary (Last 24 hours) at 02/26/2021 1003 Last data filed at 02/26/2021 0529 Gross per 24 hour  Intake 1583.31 ml  Output --  Net 1583.31 ml   Filed Weights   02/23/21 2245 02/24/21 2101  Weight: 70.3 kg 70.3 kg    Exam: Awake Alert, Oriented *3, No new F.N deficits, Normal affect [place].AT,PERRAL Supple Neck,No JVD, No cervical lymphadenopathy appriciated.  Symmetrical Chest wall movement, Good air movement bilaterally, CTAB RRR,No Gallops,Rubs or new Murmurs, No Parasternal Heave +ve B.Sounds, Abd Soft, Non tender, No organomegaly appriciated, No rebound -guarding or rigidity. No Cyanosis, Clubbing or edema, No new Rash or bruise   PERTINENT RADIOLOGIC STUDIES: CT ABDOMEN PELVIS WO CONTRAST  Result Date: 02/24/2021 CLINICAL DATA:  Abdominal pain, fever, gradual persistent progressively worsening abdominal pain all over, sickle cell crisis, associated nausea, vomiting, diarrhea, dehydration EXAM: CT ABDOMEN AND PELVIS WITHOUT CONTRAST TECHNIQUE: Multidetector CT imaging of the abdomen and pelvis was performed following the standard protocol without IV contrast. Sagittal and coronal MPR images reconstructed from axial data set. Dilute oral contrast was administered. COMPARISON:  None FINDINGS: Lower chest: Lung bases clear Hepatobiliary: Gallstone in lower gallbladder segment 6 mm diameter. Gallbladder and  liver otherwise normal appearance. Pancreas: Unremarkable Spleen: Normal appearance Adrenals/Urinary Tract: Adrenal glands grossly normal. Kidneys and bladder normal appearance. Ureters not visualized. Stomach/Bowel: Appendix obscured. Bowel wall thickening of sigmoid colon consistent with colitis. Questionable wall thickening of portions of the ascending colon versus artifact from underdistention. Stomach and remaining bowel loops normal appearance. Vascular/Lymphatic: Aorta normal caliber. No gross adenopathy identified though assessment is limited by lack of IV contrast and adequate tissue planes. Reproductive: Unremarkable prostate gland Other: No gross free air or free fluid. Small linear collection of air is seen within the LEFT rectus abdominus muscle/fascia of uncertain etiology. No additional rectus abdominus findings are identified, with muscles appearing symmetric in size and attenuation without edema or hemorrhage. Musculoskeletal: Osseous structures unremarkable. IMPRESSION: Bowel wall thickening of the sigmoid colon consistent with colitis; differential diagnosis includes infection and inflammatory bowel disease, ischemia considered a less likely cause. Questionable wall thickening of portions of ascending colon versus artifact from underdistention. Cholelithiasis. Small linear collection of air within the LEFT rectus abdominus muscle/fascia of uncertain etiology; this could be related to prior injection or attempted vascular access more inferiorly, recommend clinical correlation. Electronically Signed   By: 02/26/2021 M.D.   On: 02/24/2021 15:08     PERTINENT LAB RESULTS: CBC: Recent Labs    02/23/21 2212 02/25/21 0148  WBC 7.2 12.0*  HGB 17.4* 15.1  HCT 50.3 42.8  PLT 203 186   CMET CMP  Component Value Date/Time   NA 132 (L) 02/25/2021 0148   K 3.6 02/25/2021 0148   CL 95 (L) 02/25/2021 0148   CO2 28 02/25/2021 0148   GLUCOSE 106 (H) 02/25/2021 0148   BUN 21 (H)  02/25/2021 0148   CREATININE 1.20 02/25/2021 0148   CALCIUM 8.5 (L) 02/25/2021 0148   PROT 7.0 02/23/2021 2212   ALBUMIN 3.4 (L) 02/23/2021 2212   AST 23 02/23/2021 2212   ALT 19 02/23/2021 2212   ALKPHOS 54 02/23/2021 2212   BILITOT 0.7 02/23/2021 2212   GFRNONAA >60 02/25/2021 0148    GFR Estimated Creatinine Clearance: 76.1 mL/min (by C-G formula based on SCr of 1.2 mg/dL). Recent Labs    02/24/21 2105  LIPASE 33   Recent Labs    02/24/21 2105  CKTOTAL 100   Invalid input(s): POCBNP No results for input(s): DDIMER in the last 72 hours. No results for input(s): HGBA1C in the last 72 hours. No results for input(s): CHOL, HDL, LDLCALC, TRIG, CHOLHDL, LDLDIRECT in the last 72 hours. No results for input(s): TSH, T4TOTAL, T3FREE, THYROIDAB in the last 72 hours.  Invalid input(s): FREET3 Recent Labs    02/23/21 2212  RETICCTPCT 1.2   Coags: Recent Labs    02/23/21 2225 02/25/21 0148  INR 1.5* 1.2   Microbiology: Recent Results (from the past 240 hour(s))  Blood culture (routine single)     Status: None (Preliminary result)   Collection Time: 02/23/21 10:42 PM   Specimen: BLOOD  Result Value Ref Range Status   Specimen Description BLOOD RIGHT ANTECUBITAL  Final   Special Requests   Final    BOTTLES DRAWN AEROBIC AND ANAEROBIC Blood Culture adequate volume   Culture   Final    NO GROWTH 3 DAYS Performed at [facility], [address] [place], [place] [address]    Report Status PENDING  Incomplete  SARS CORONAVIRUS 2 (TAT 6-24 HRS) Nasopharyngeal Nasopharyngeal Swab     Status: None   Collection Time: 02/24/21  2:19 AM   Specimen: Nasopharyngeal Swab  Result Value Ref Range Status   SARS Coronavirus 2 NEGATIVE NEGATIVE Final    Comment: (NOTE) SARS-CoV-2 target nucleic acids are NOT DETECTED.  The SARS-CoV-2 RNA is generally detectable in upper and lower respiratory specimens during the acute phase of infection. Negative results do not preclude  SARS-CoV-2 infection, do not rule out co-infections with other pathogens, and should not be used as the sole basis for treatment or other patient management decisions. Negative results must be combined with clinical observations, patient history, and epidemiological information. The expected result is Negative.  Fact Sheet for Patients: HairSlick.no  Fact Sheet for Healthcare Providers: quierodirigir.com  This test is not yet approved or cleared by the [place] FDA and  has been authorized for detection and/or diagnosis of SARS-CoV-2 by FDA under an Emergency Use Authorization (EUA). This EUA will remain  in effect (meaning this test can be used) for the duration of the COVID-19 declaration under Se ction 564(b)(1) of the Act, 21 U.S.C. section 360bbb-3(b)(1), unless the authorization is terminated or revoked sooner.  Performed at [facility], [address], [place], [place] [address]   Urine culture     Status: None   Collection Time: 02/25/21  2:51 AM   Specimen: Urine, Clean Catch  Result Value Ref Range Status   Specimen Description URINE, CLEAN CATCH  Final   Special Requests NONE  Final   Culture   Final  NO GROWTH Performed at [facility], [address] [place], [place] [id number]    Report Status 02/26/2021 FINAL  Final    FURTHER DISCHARGE INSTRUCTIONS:  Get Medicines reviewed and adjusted: Please take all your medications with you for your next visit with your Primary MD  Laboratory/radiological data: Please request your Primary MD to go over all hospital tests and procedure/radiological results at the follow up, please ask your Primary MD to get all Hospital records sent to his/her office.  In some cases, they will be blood work, cultures and biopsy results pending at the time of your discharge. Please request that your primary care M.D. goes through all the records of your hospital  data and follows up on these results.  Also Note the following: If you experience worsening of your admission symptoms, develop shortness of breath, life threatening emergency, suicidal or homicidal thoughts you must seek medical attention immediately by calling 911 or calling your MD immediately  if symptoms less severe.  You must read complete instructions/literature along with all the possible adverse reactions/side effects for all the Medicines you take and that have been prescribed to you. Take any new Medicines after you have completely understood and accpet all the possible adverse reactions/side effects.   Do not drive when taking Pain medications or sleeping medications (Benzodaizepines)  Do not take more than prescribed Pain, Sleep and Anxiety Medications. It is not advisable to combine anxiety,sleep and pain medications without talking with your primary care practitioner  Special Instructions: If you have smoked or chewed Tobacco  in the last 2 yrs please stop smoking, stop any regular Alcohol  and or any Recreational drug use.  Wear Seat belts while driving.  Please note: You were cared for by a hospitalist during your hospital stay. Once you are discharged, your primary care physician will handle any further medical issues. Please note that NO REFILLS for any discharge medications will be authorized once you are discharged, as it is imperative that you return to your primary care physician (or establish a relationship with a primary care physician if you do not have one) for your post hospital discharge needs so that they can reassess your need for medications and monitor your lab values.  Total Time spent coordinating discharge including counseling, education and face to face time equals 35 minutes.  Signed: Elenna Spratling 02/26/2021 10:03 AM

## 2021-02-26 NOTE — Progress Notes (Signed)
[name] to be D/C'd Home per MD order.  Discussed with the patient and all questions fully answered.  VSS, Skin clean, dry and intact without evidence of skin break down, no evidence of skin tears noted. IV catheter discontinued intact. Site without signs and symptoms of complications. Dressing and pressure applied.  An After Visit Summary was printed and given to the patient.  D/c education completed with patient/family including follow up instructions, medication list, d/c activities limitations if indicated, with other d/c instructions as indicated by MD - patient able to verbalize understanding, all questions fully answered.   Patient instructed to return to ED, call 911, or call MD for any changes in condition.   Patient escorted via WC, and D/C home via private auto.  Velva Harman 02/26/2021 12:53 PM

## 2021-02-28 LAB — CULTURE, BLOOD (SINGLE)
Culture: NO GROWTH
Special Requests: ADEQUATE

## 2022-11-17 IMAGING — CT CT ABD-PELV W/O CM
2 of 4 series · 16 of 46 positions shown, 18 images · non-contrast
Comparison: None

CLINICAL DATA: Abdominal pain, fever, gradual persistent
progressively worsening abdominal pain all over, sickle cell crisis,
associated nausea, vomiting, diarrhea, dehydration

EXAM:
CT ABDOMEN AND PELVIS WITHOUT CONTRAST
TECHNIQUE: Multidetector CT imaging of the abdomen and pelvis was performed
following the standard protocol without IV contrast. Sagittal and
coronal MPR images reconstructed from axial data set. Dilute oral
contrast was administered.

[Series 3: abd/ pelvis 5.0 i30f 2 · axial · 0.68mm/px · z∈[-490,-70]mm · 13 of 92 slices shown, 15 images]
[im 4/92  soft-tissue]
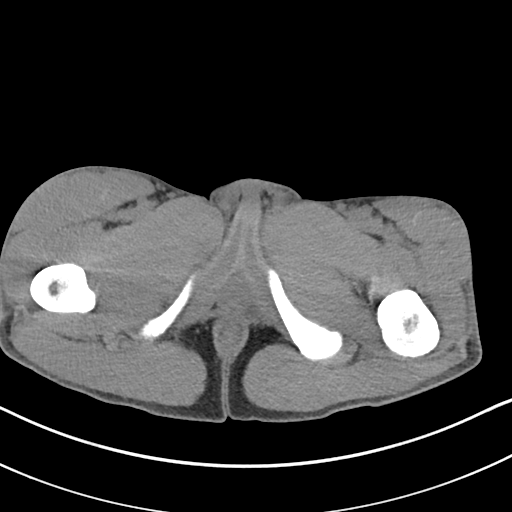
[im 4/92  bone]
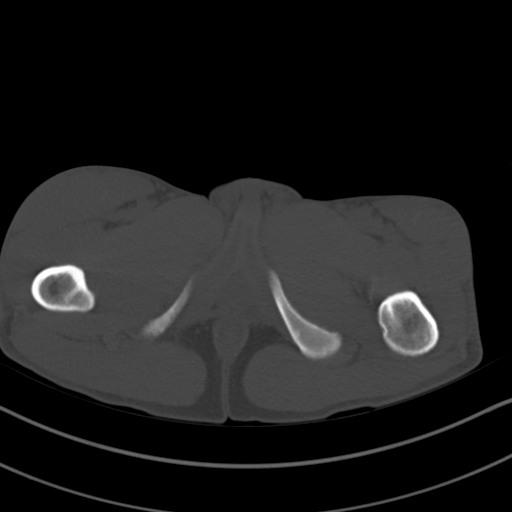
[im 12/92  soft-tissue]
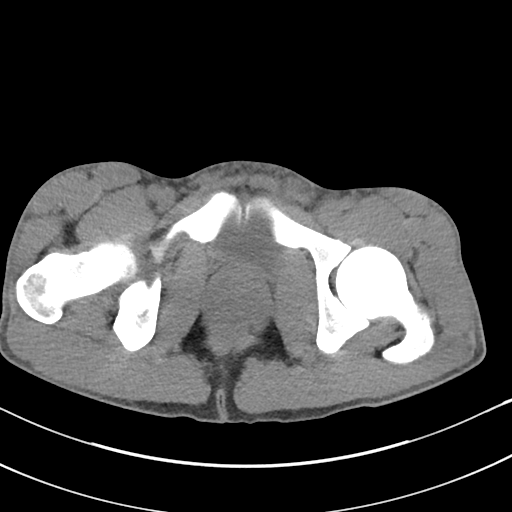
[im 19/92  soft-tissue]
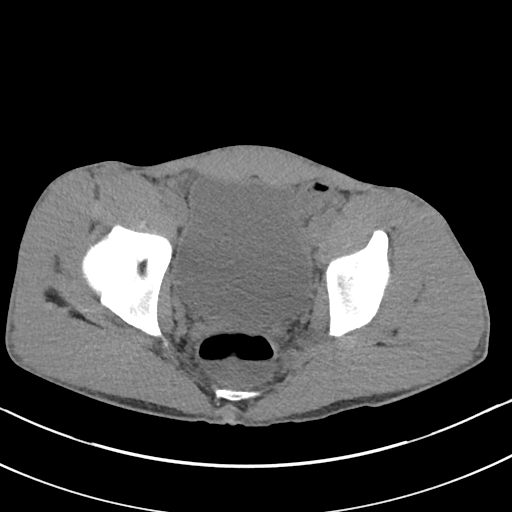
[im 27/92  soft-tissue]
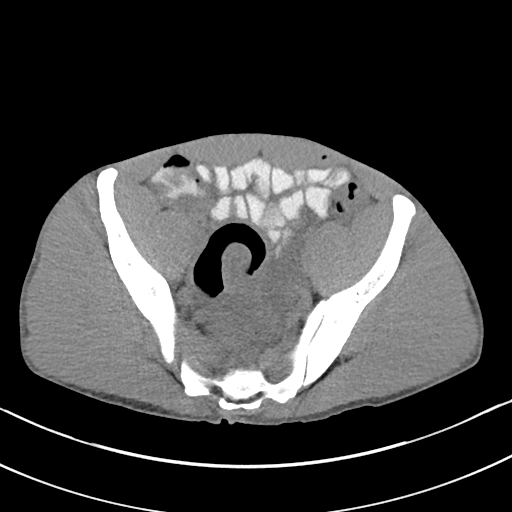
[im 31/92  soft-tissue]
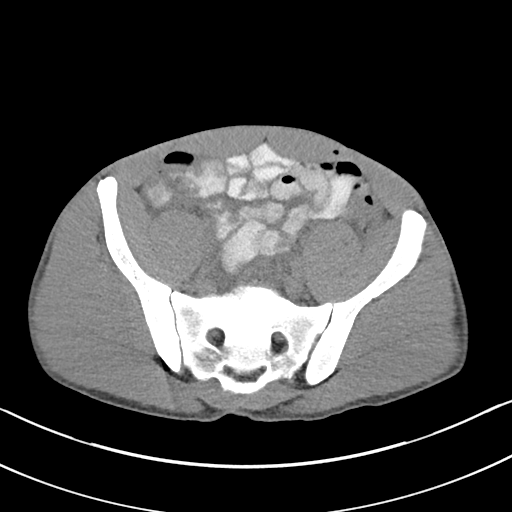
[im 38/92  soft-tissue]
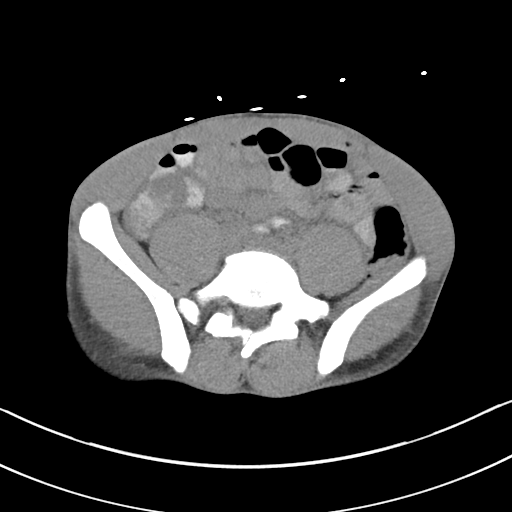
[im 46/92  soft-tissue]
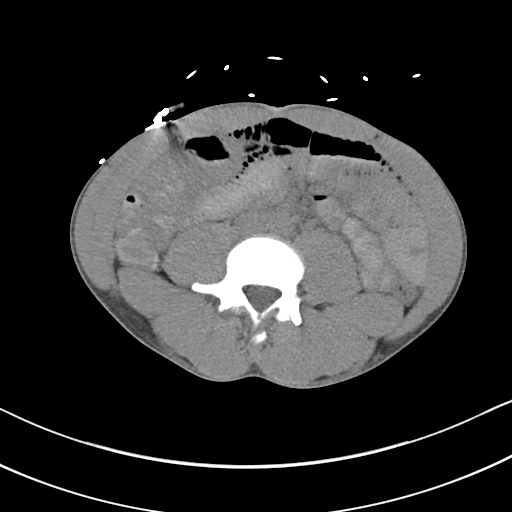
[im 54/92  soft-tissue]
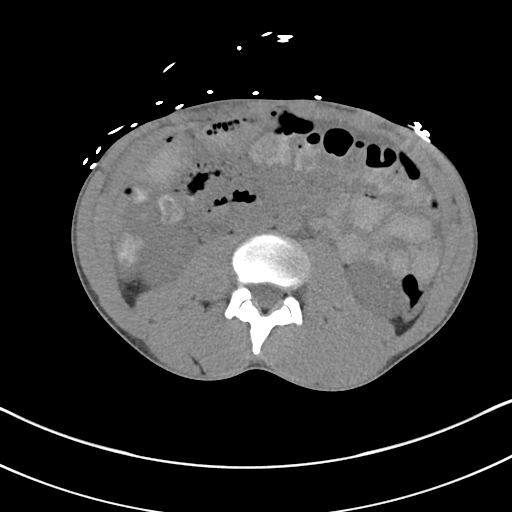
[im 61/92  soft-tissue]
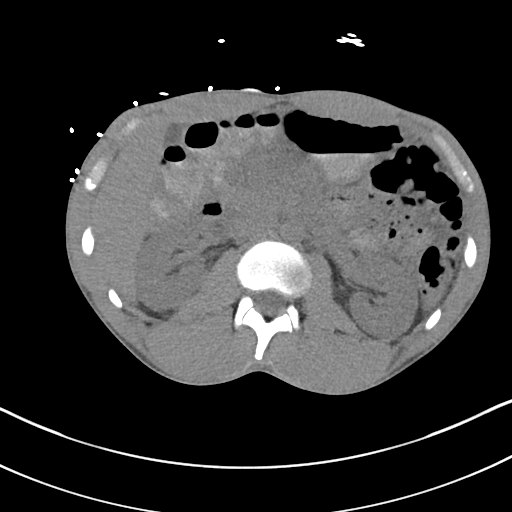
[im 61/92  bone]
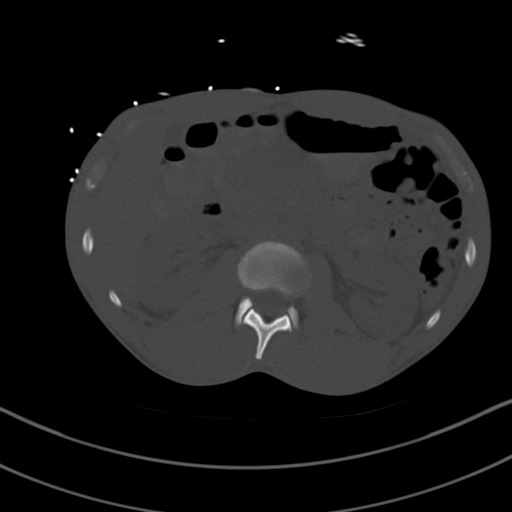
[im 65/92  soft-tissue]
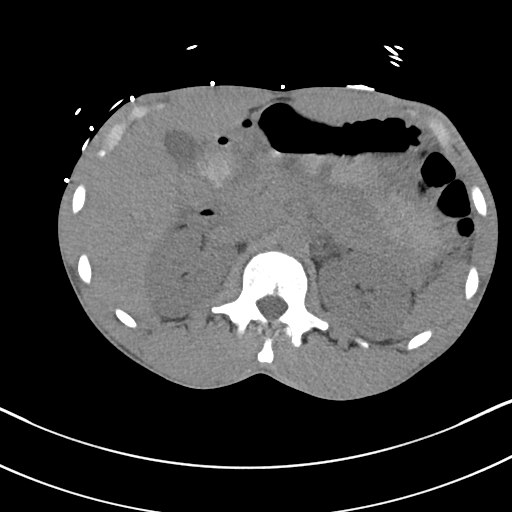
[im 73/92  soft-tissue]
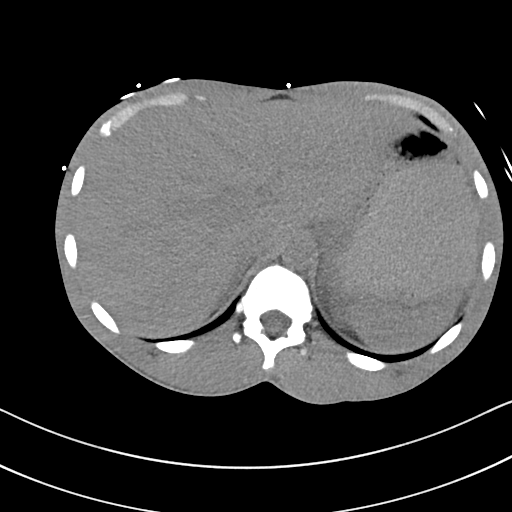
[im 80/92  soft-tissue]
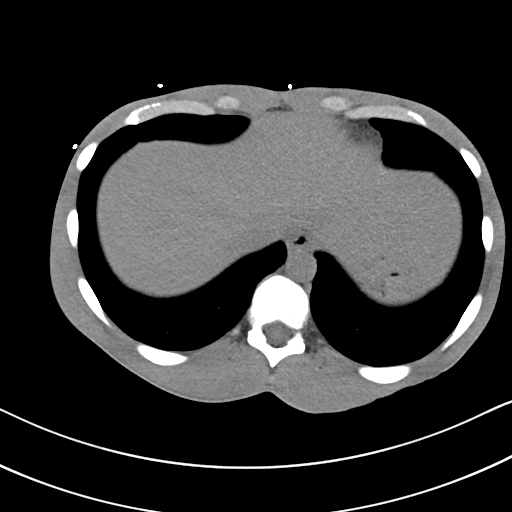
[im 88/92  soft-tissue]
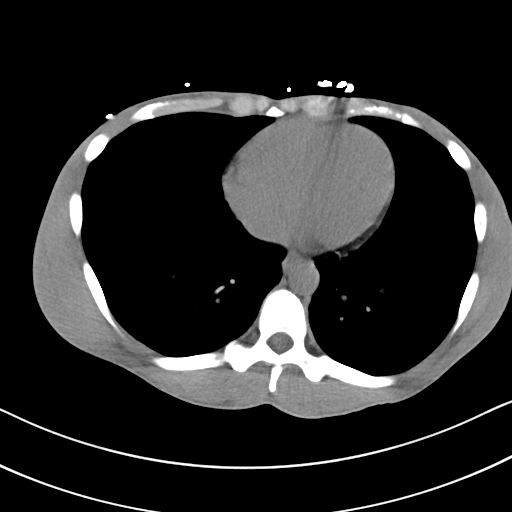

[Series 6: cor st · coronal · 0.73mm/px · 3 of 85 slices shown]
[im 29/85  soft-tissue]
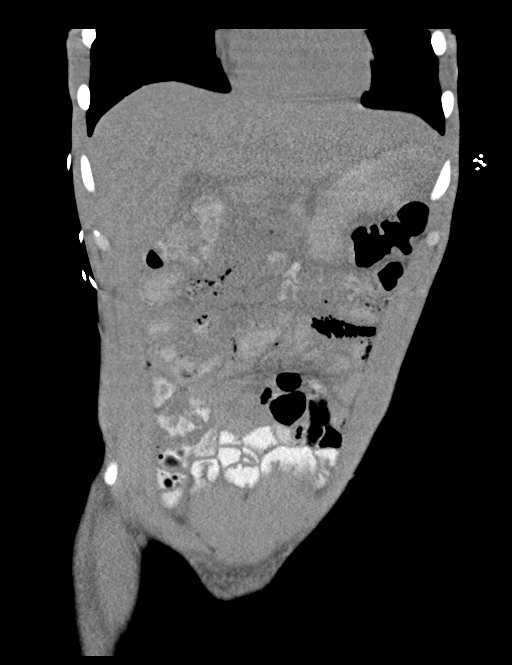
[im 38/85  soft-tissue]
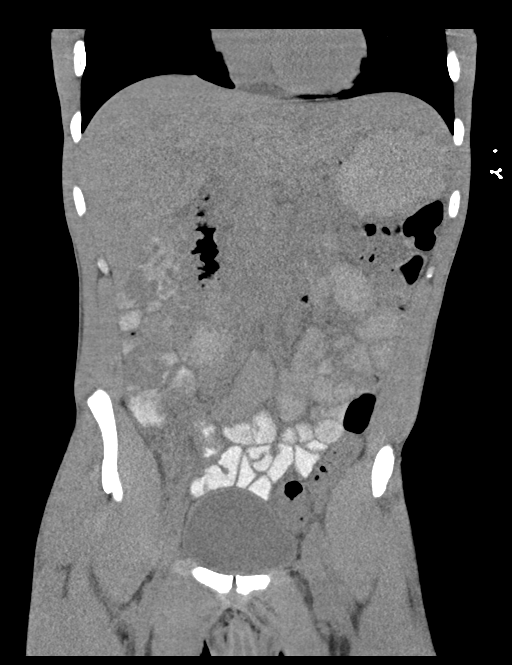
[im 47/85  soft-tissue]
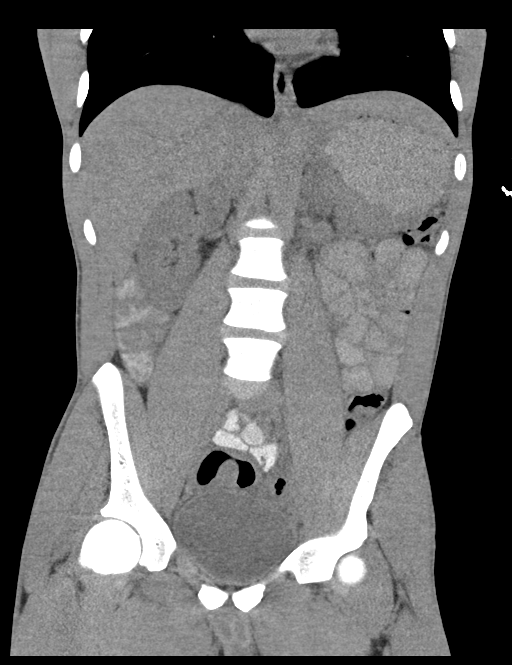

[16 of 46 positions shown; findings below may reference images not displayed]

FINDINGS: Lower chest: Lung bases clear

Hepatobiliary: Gallstone in lower gallbladder segment 6 mm diameter.
Gallbladder and liver otherwise normal appearance.

Pancreas: Unremarkable

Spleen: Normal appearance

Adrenals/Urinary Tract: Adrenal glands grossly normal. Kidneys and
bladder normal appearance. Ureters not visualized.

Stomach/Bowel: Appendix obscured. Bowel wall thickening of sigmoid
colon consistent with colitis. Questionable wall thickening of
portions of the ascending colon versus artifact from
underdistention. Stomach and remaining bowel loops normal
appearance.

Vascular/Lymphatic: Aorta normal caliber. No gross adenopathy
identified though assessment is limited by lack of IV contrast and
adequate tissue planes.

Reproductive: Unremarkable prostate gland

Other: No gross free air or free fluid. Small linear collection of
air is seen within the LEFT rectus abdominus muscle/fascia of
uncertain etiology. No additional rectus abdominus findings are
identified, with muscles appearing symmetric in size and attenuation
without edema or hemorrhage.

Musculoskeletal: Osseous structures unremarkable.
IMPRESSION: Bowel wall thickening of the sigmoid colon consistent with colitis;
differential diagnosis includes infection and inflammatory bowel
disease, ischemia considered a less likely cause.

Questionable wall thickening of portions of ascending colon versus
artifact from underdistention.

Cholelithiasis.

Small linear collection of air within the LEFT rectus abdominus
muscle/fascia of uncertain etiology; this could be related to prior
injection or attempted vascular access more inferiorly, recommend
clinical correlation.
